# Patient Record
Sex: Female | Born: 1960 | Race: White | Hispanic: No | Smoking: Never smoker
Health system: Southern US, Community
[De-identification: ages and names within clinical notes are randomized; demographics above are authoritative.]

## PROBLEM LIST (undated history)

## (undated) DIAGNOSIS — Z973 Presence of spectacles and contact lenses: Secondary | ICD-10-CM

## (undated) DIAGNOSIS — I10 Essential (primary) hypertension: Secondary | ICD-10-CM

## (undated) DIAGNOSIS — C801 Malignant (primary) neoplasm, unspecified: Secondary | ICD-10-CM

## (undated) DIAGNOSIS — M79669 Pain in unspecified lower leg: Secondary | ICD-10-CM

## (undated) HISTORY — PX: WISDOM TOOTH EXTRACTION: SHX21

## (undated) HISTORY — DX: Pain in unspecified lower leg: M79.669

## (undated) HISTORY — PX: COLONOSCOPY: SHX174

## (undated) HISTORY — PX: FINGER SURGERY: SHX640

## (undated) HISTORY — DX: Malignant (primary) neoplasm, unspecified: C80.1

---

## 1999-12-31 ENCOUNTER — Other Ambulatory Visit: Admission: RE | Admit: 1999-12-31 | Discharge: 1999-12-31 | Payer: Self-pay | Admitting: Gynecology

## 2000-07-23 ENCOUNTER — Other Ambulatory Visit: Admission: RE | Admit: 2000-07-23 | Discharge: 2000-07-23 | Payer: Self-pay | Admitting: Gynecology

## 2000-09-22 ENCOUNTER — Other Ambulatory Visit: Admission: RE | Admit: 2000-09-22 | Discharge: 2000-09-22 | Payer: Self-pay | Admitting: Gynecology

## 2001-03-16 ENCOUNTER — Other Ambulatory Visit: Admission: RE | Admit: 2001-03-16 | Discharge: 2001-03-16 | Payer: Self-pay | Admitting: Gynecology

## 2001-09-30 ENCOUNTER — Other Ambulatory Visit: Admission: RE | Admit: 2001-09-30 | Discharge: 2001-09-30 | Payer: Self-pay | Admitting: Gynecology

## 2002-11-09 ENCOUNTER — Other Ambulatory Visit: Admission: RE | Admit: 2002-11-09 | Discharge: 2002-11-09 | Payer: Self-pay | Admitting: Gynecology

## 2002-12-08 ENCOUNTER — Encounter: Admission: RE | Admit: 2002-12-08 | Discharge: 2002-12-08 | Payer: Self-pay | Admitting: Gynecology

## 2002-12-08 ENCOUNTER — Encounter: Payer: Self-pay | Admitting: Gynecology

## 2003-12-11 ENCOUNTER — Encounter: Admission: RE | Admit: 2003-12-11 | Discharge: 2003-12-11 | Payer: Self-pay | Admitting: Gynecology

## 2003-12-11 ENCOUNTER — Other Ambulatory Visit: Admission: RE | Admit: 2003-12-11 | Discharge: 2003-12-11 | Payer: Self-pay | Admitting: Gynecology

## 2004-12-12 ENCOUNTER — Other Ambulatory Visit: Admission: RE | Admit: 2004-12-12 | Discharge: 2004-12-12 | Payer: Self-pay | Admitting: Gynecology

## 2004-12-17 ENCOUNTER — Encounter: Admission: RE | Admit: 2004-12-17 | Discharge: 2004-12-17 | Payer: Self-pay | Admitting: Gynecology

## 2005-02-06 ENCOUNTER — Ambulatory Visit: Payer: Self-pay | Admitting: Hematology

## 2005-07-16 ENCOUNTER — Ambulatory Visit: Payer: Self-pay | Admitting: Family Medicine

## 2005-09-26 ENCOUNTER — Ambulatory Visit: Payer: Self-pay | Admitting: Family Medicine

## 2006-01-12 ENCOUNTER — Other Ambulatory Visit: Admission: RE | Admit: 2006-01-12 | Discharge: 2006-01-12 | Payer: Self-pay | Admitting: Gynecology

## 2006-01-12 ENCOUNTER — Encounter: Admission: RE | Admit: 2006-01-12 | Discharge: 2006-01-12 | Payer: Self-pay | Admitting: Gynecology

## 2007-01-19 ENCOUNTER — Other Ambulatory Visit: Admission: RE | Admit: 2007-01-19 | Discharge: 2007-01-19 | Payer: Self-pay | Admitting: Gynecology

## 2007-01-19 ENCOUNTER — Encounter: Admission: RE | Admit: 2007-01-19 | Discharge: 2007-01-19 | Payer: Self-pay | Admitting: Gynecology

## 2012-12-12 DIAGNOSIS — I10 Essential (primary) hypertension: Secondary | ICD-10-CM | POA: Insufficient documentation

## 2013-09-02 ENCOUNTER — Other Ambulatory Visit: Payer: Self-pay | Admitting: Orthopedic Surgery

## 2013-09-28 ENCOUNTER — Encounter (HOSPITAL_BASED_OUTPATIENT_CLINIC_OR_DEPARTMENT_OTHER): Payer: Self-pay | Admitting: *Deleted

## 2013-09-28 NOTE — Progress Notes (Signed)
Finally got correct number-will come in 630 to get istat and ekg since she does take a htn med-

## 2013-09-29 ENCOUNTER — Encounter (HOSPITAL_BASED_OUTPATIENT_CLINIC_OR_DEPARTMENT_OTHER): Payer: Self-pay | Admitting: *Deleted

## 2013-09-29 ENCOUNTER — Ambulatory Visit (HOSPITAL_BASED_OUTPATIENT_CLINIC_OR_DEPARTMENT_OTHER)
Admission: RE | Admit: 2013-09-29 | Discharge: 2013-09-29 | Disposition: A | Discharge status: Home / Self Care | Payer: BC Managed Care – PPO | Source: Ambulatory Visit | Attending: Orthopedic Surgery | Admitting: Orthopedic Surgery

## 2013-09-29 ENCOUNTER — Encounter (HOSPITAL_BASED_OUTPATIENT_CLINIC_OR_DEPARTMENT_OTHER): Payer: BC Managed Care – PPO | Admitting: Anesthesiology

## 2013-09-29 ENCOUNTER — Ambulatory Visit (HOSPITAL_BASED_OUTPATIENT_CLINIC_OR_DEPARTMENT_OTHER): Payer: BC Managed Care – PPO | Admitting: Anesthesiology

## 2013-09-29 ENCOUNTER — Encounter (HOSPITAL_BASED_OUTPATIENT_CLINIC_OR_DEPARTMENT_OTHER)
Admission: RE | Disposition: A | Discharge status: Home / Self Care | Payer: Self-pay | Source: Ambulatory Visit | Attending: Orthopedic Surgery

## 2013-09-29 DIAGNOSIS — B079 Viral wart, unspecified: Secondary | ICD-10-CM | POA: Insufficient documentation

## 2013-09-29 DIAGNOSIS — M19049 Primary osteoarthritis, unspecified hand: Secondary | ICD-10-CM | POA: Insufficient documentation

## 2013-09-29 DIAGNOSIS — M6740 Ganglion, unspecified site: Secondary | ICD-10-CM | POA: Insufficient documentation

## 2013-09-29 HISTORY — DX: Presence of spectacles and contact lenses: Z97.3

## 2013-09-29 HISTORY — DX: Essential (primary) hypertension: I10

## 2013-09-29 HISTORY — PX: EXCISION METACARPAL MASS: SHX6372

## 2013-09-29 LAB — POCT I-STAT, CHEM 8
HCT: 40 % (ref 36.0–46.0)
Hemoglobin: 13.6 g/dL (ref 12.0–15.0)
Sodium: 140 mEq/L (ref 135–145)
TCO2: 22 mmol/L (ref 0–100)

## 2013-09-29 SURGERY — EXCISION METACARPAL MASS
Anesthesia: General | Site: Hand | Laterality: Left | Wound class: Clean

## 2013-09-29 MED ORDER — FENTANYL CITRATE 0.05 MG/ML IJ SOLN
25.0000 ug | INTRAMUSCULAR | Status: DC | PRN
  Administered 2013-09-29 (×2): 25 ug via INTRAVENOUS

## 2013-09-29 MED ORDER — DEXAMETHASONE SODIUM PHOSPHATE 4 MG/ML IJ SOLN
INTRAMUSCULAR | Status: DC | PRN
  Administered 2013-09-29: 10 mg via INTRAVENOUS

## 2013-09-29 MED ORDER — CHLORHEXIDINE GLUCONATE 4 % EX LIQD: 60.0000 mL | Freq: Once | CUTANEOUS | Status: DC

## 2013-09-29 MED ORDER — BUPIVACAINE HCL (PF) 0.25 % IJ SOLN
INTRAMUSCULAR | Status: DC | PRN
  Administered 2013-09-29: 9 mL

## 2013-09-29 MED ORDER — MIDAZOLAM HCL 2 MG/2ML IJ SOLN: 1.0000 mg | INTRAMUSCULAR | Status: DC | PRN

## 2013-09-29 MED ORDER — FENTANYL CITRATE 0.05 MG/ML IJ SOLN
INTRAMUSCULAR | Status: AC
  Filled 2013-09-29: qty 4

## 2013-09-29 MED ORDER — MIDAZOLAM HCL 2 MG/2ML IJ SOLN
INTRAMUSCULAR | Status: AC
  Filled 2013-09-29: qty 2

## 2013-09-29 MED ORDER — LIDOCAINE HCL (PF) 1 % IJ SOLN
INTRAMUSCULAR | Status: AC
  Filled 2013-09-29: qty 5

## 2013-09-29 MED ORDER — ONDANSETRON HCL 4 MG/2ML IJ SOLN
INTRAMUSCULAR | Status: DC | PRN
  Administered 2013-09-29: 4 mg via INTRAVENOUS

## 2013-09-29 MED ORDER — CEFAZOLIN SODIUM-DEXTROSE 2-3 GM-% IV SOLR
2.0000 g | INTRAVENOUS | Status: AC
  Administered 2013-09-29: 2 g via INTRAVENOUS

## 2013-09-29 MED ORDER — PROPOFOL 10 MG/ML IV BOLUS
INTRAVENOUS | Status: DC | PRN
  Administered 2013-09-29: 200 mg via INTRAVENOUS

## 2013-09-29 MED ORDER — OXYCODONE HCL 5 MG/5ML PO SOLN: 5.0000 mg | Freq: Once | ORAL | Status: DC | PRN

## 2013-09-29 MED ORDER — LACTATED RINGERS IV SOLN
INTRAVENOUS | Status: DC
  Administered 2013-09-29 (×2): via INTRAVENOUS

## 2013-09-29 MED ORDER — PROMETHAZINE HCL 25 MG/ML IJ SOLN: 6.2500 mg | INTRAMUSCULAR | Status: DC | PRN

## 2013-09-29 MED ORDER — BUPIVACAINE HCL (PF) 0.25 % IJ SOLN
INTRAMUSCULAR | Status: AC
  Filled 2013-09-29: qty 30

## 2013-09-29 MED ORDER — FENTANYL CITRATE 0.05 MG/ML IJ SOLN
INTRAMUSCULAR | Status: DC | PRN
  Administered 2013-09-29: 100 ug via INTRAVENOUS

## 2013-09-29 MED ORDER — OXYCODONE HCL 5 MG PO TABS: 5.0000 mg | ORAL_TABLET | Freq: Once | ORAL | Status: DC | PRN

## 2013-09-29 MED ORDER — LIDOCAINE HCL (CARDIAC) 20 MG/ML IV SOLN
INTRAVENOUS | Status: DC | PRN
  Administered 2013-09-29: 75 mg via INTRAVENOUS

## 2013-09-29 MED ORDER — FENTANYL CITRATE 0.05 MG/ML IJ SOLN: 50.0000 ug | INTRAMUSCULAR | Status: DC | PRN

## 2013-09-29 MED ORDER — FENTANYL CITRATE 0.05 MG/ML IJ SOLN
INTRAMUSCULAR | Status: AC
  Filled 2013-09-29: qty 2

## 2013-09-29 MED ORDER — MIDAZOLAM HCL 5 MG/5ML IJ SOLN
INTRAMUSCULAR | Status: DC | PRN
  Administered 2013-09-29: 2 mg via INTRAVENOUS

## 2013-09-29 MED ORDER — CEFAZOLIN SODIUM-DEXTROSE 2-3 GM-% IV SOLR
INTRAVENOUS | Status: AC
  Filled 2013-09-29: qty 50

## 2013-09-29 MED ORDER — HYDROCODONE-ACETAMINOPHEN 5-325 MG PO TABS: ORAL_TABLET | ORAL | Status: DC

## 2013-09-29 MED ORDER — PROPOFOL 10 MG/ML IV EMUL
INTRAVENOUS | Status: AC
  Filled 2013-09-29: qty 50

## 2013-09-29 SURGICAL SUPPLY — 58 items
APL SKNCLS STERI-STRIP NONHPOA (GAUZE/BANDAGES/DRESSINGS)
BANDAGE COBAN STERILE 2 (GAUZE/BANDAGES/DRESSINGS) IMPLANT
BANDAGE CONFORM 2  STR LF (GAUZE/BANDAGES/DRESSINGS) IMPLANT
BANDAGE ELASTIC 3 VELCRO ST LF (GAUZE/BANDAGES/DRESSINGS) IMPLANT
BANDAGE GAUZE ELAST BULKY 4 IN (GAUZE/BANDAGES/DRESSINGS) IMPLANT
BANDAGE GAUZE STRT 1 STR LF (GAUZE/BANDAGES/DRESSINGS) IMPLANT
BENZOIN TINCTURE PRP APPL 2/3 (GAUZE/BANDAGES/DRESSINGS) IMPLANT
BLADE MINI RND TIP GREEN BEAV (BLADE) IMPLANT
BLADE SURG 15 STRL LF DISP TIS (BLADE) ×2 IMPLANT
BLADE SURG 15 STRL SS (BLADE) ×4
BNDG CMPR 9X4 STRL LF SNTH (GAUZE/BANDAGES/DRESSINGS) ×1
BNDG CMPR MD 5X2 ELC HKLP STRL (GAUZE/BANDAGES/DRESSINGS)
BNDG COHESIVE 1X5 TAN STRL LF (GAUZE/BANDAGES/DRESSINGS) ×1 IMPLANT
BNDG ELASTIC 2 VLCR STRL LF (GAUZE/BANDAGES/DRESSINGS) IMPLANT
BNDG ESMARK 4X9 LF (GAUZE/BANDAGES/DRESSINGS) ×1 IMPLANT
BNDG PLASTER X FAST 3X3 WHT LF (CAST SUPPLIES) IMPLANT
BNDG PLSTR 9X3 FST ST WHT (CAST SUPPLIES)
CHLORAPREP W/TINT 26ML (MISCELLANEOUS) ×2 IMPLANT
CORDS BIPOLAR (ELECTRODE) ×2 IMPLANT
COVER MAYO STAND STRL (DRAPES) ×2 IMPLANT
COVER TABLE BACK 60X90 (DRAPES) ×2 IMPLANT
CUFF TOURNIQUET SINGLE 18IN (TOURNIQUET CUFF) ×2 IMPLANT
DRAPE EXTREMITY T 121X128X90 (DRAPE) ×2 IMPLANT
DRAPE SURG 17X23 STRL (DRAPES) ×2 IMPLANT
GAUZE XEROFORM 1X8 LF (GAUZE/BANDAGES/DRESSINGS) ×2 IMPLANT
GLOVE BIO SURGEON STRL SZ7.5 (GLOVE) ×2 IMPLANT
GLOVE BIOGEL PI IND STRL 7.0 (GLOVE) IMPLANT
GLOVE BIOGEL PI IND STRL 7.5 (GLOVE) IMPLANT
GLOVE BIOGEL PI IND STRL 8 (GLOVE) ×1 IMPLANT
GLOVE BIOGEL PI INDICATOR 7.0 (GLOVE) ×1
GLOVE BIOGEL PI INDICATOR 7.5 (GLOVE) ×1
GLOVE BIOGEL PI INDICATOR 8 (GLOVE) ×1
GLOVE SURG SS PI 7.0 STRL IVOR (GLOVE) ×1 IMPLANT
GOWN BRE IMP PREV XXLGXLNG (GOWN DISPOSABLE) ×2 IMPLANT
GOWN PREVENTION PLUS XLARGE (GOWN DISPOSABLE) ×2 IMPLANT
NDL HYPO 25X1 1.5 SAFETY (NEEDLE) ×1 IMPLANT
NEEDLE HYPO 25X1 1.5 SAFETY (NEEDLE) ×2 IMPLANT
NS IRRIG 1000ML POUR BTL (IV SOLUTION) ×2 IMPLANT
PACK BASIN DAY SURGERY FS (CUSTOM PROCEDURE TRAY) ×2 IMPLANT
PAD CAST 3X4 CTTN HI CHSV (CAST SUPPLIES) IMPLANT
PAD CAST 4YDX4 CTTN HI CHSV (CAST SUPPLIES) IMPLANT
PADDING CAST ABS 4INX4YD NS (CAST SUPPLIES) ×1
PADDING CAST ABS COTTON 4X4 ST (CAST SUPPLIES) ×1 IMPLANT
PADDING CAST COTTON 3X4 STRL (CAST SUPPLIES)
PADDING CAST COTTON 4X4 STRL (CAST SUPPLIES)
SPLINT FNGR PLAIN END 5/8X3.25 (CAST SUPPLIES) IMPLANT
SPLINT PLASTALUME 3 1/4 (CAST SUPPLIES) ×2
SPONGE GAUZE 4X4 12PLY (GAUZE/BANDAGES/DRESSINGS) ×2 IMPLANT
STOCKINETTE 4X48 STRL (DRAPES) ×2 IMPLANT
STRIP CLOSURE SKIN 1/2X4 (GAUZE/BANDAGES/DRESSINGS) IMPLANT
SUT ETHILON 3 0 PS 1 (SUTURE) IMPLANT
SUT ETHILON 4 0 PS 2 18 (SUTURE) ×2 IMPLANT
SUT ETHILON 5 0 P 3 18 (SUTURE) ×1
SUT NYLON ETHILON 5-0 P-3 1X18 (SUTURE) IMPLANT
SYR BULB 3OZ (MISCELLANEOUS) ×2 IMPLANT
SYR CONTROL 10ML LL (SYRINGE) ×2 IMPLANT
TOWEL OR 17X24 6PK STRL BLUE (TOWEL DISPOSABLE) ×3 IMPLANT
UNDERPAD 30X30 INCONTINENT (UNDERPADS AND DIAPERS) ×1 IMPLANT

## 2013-09-29 NOTE — Op Note (Signed)

## 2013-09-29 NOTE — Anesthesia Procedure Notes (Signed)
Procedure Name: LMA Insertion Date/Time: 09/29/2013 8:39 AM Performed by: Zenia Resides D Pre-anesthesia Checklist: Patient identified, Emergency Drugs available, Suction available and Patient being monitored Patient Re-evaluated:Patient Re-evaluated prior to inductionOxygen Delivery Method: Circle System Utilized Preoxygenation: Pre-oxygenation with 100% oxygen Intubation Type: IV induction Ventilation: Mask ventilation without difficulty LMA: LMA inserted LMA Size: 4.0 Number of attempts: 1 Airway Equipment and Method: bite block Placement Confirmation: positive ETCO2 Tube secured with: Tape Dental Injury: Teeth and Oropharynx as per pre-operative assessment

## 2013-09-29 NOTE — Anesthesia Preprocedure Evaluation (Addendum)
Anesthesia Evaluation  Patient identified by MRN, date of birth, ID band Patient awake    Reviewed: Allergy & Precautions, H&P , NPO status   History of Anesthesia Complications Negative for: history of anesthetic complications  Airway Mallampati: II  Neck ROM: Full    Dental   Pulmonary neg pulmonary ROS,  breath sounds clear to auscultation        Cardiovascular hypertension, Rhythm:Regular Rate:Normal     Neuro/Psych    GI/Hepatic   Endo/Other    Renal/GU      Musculoskeletal   Abdominal   Peds  Hematology   Anesthesia Other Findings   Reproductive/Obstetrics                           Anesthesia Physical Anesthesia Plan  ASA: II  Anesthesia Plan: General   Post-op Pain Management:    Induction: Intravenous  Airway Management Planned: LMA  Additional Equipment:   Intra-op Plan:   Post-operative Plan: Extubation in OR  Informed Consent: I have reviewed the patients History and Physical, chart, labs and discussed the procedure including the risks, benefits and alternatives for the proposed anesthesia with the patient or authorized representative who has indicated his/her understanding and acceptance.   Dental advisory given  Plan Discussed with: CRNA and Surgeon  Anesthesia Plan Comments:        Anesthesia Quick Evaluation

## 2013-09-29 NOTE — H&P (Addendum)
Cheryl Willis is an 52 y.o. female.   Chief Complaint: left index finger mass HPI: 52 yo rhd female with 2 months cyst on left index finger.  This is bothersome to her.  Pain when bumped.  No injuries noted.  No drainage.  She wishes to have it removed.  Past Medical History  Diagnosis Date  . Hypertension   . Wears glasses     Past Surgical History  Procedure Laterality Date  . Wisdom tooth extraction    . Colonoscopy      History reviewed. No pertinent family history. Social History:  reports that she has never smoked. She does not have any smokeless tobacco history on file. She reports that she drinks alcohol. She reports that she does not use illicit drugs.  Allergies: No Known Allergies  Medications Prior to Admission  Medication Sig Dispense Refill  . aspirin 81 MG tablet Take 81 mg by mouth daily.      . calcium carbonate (OS-CAL) 600 MG TABS tablet Take 600 mg by mouth 2 (two) times daily with a meal.      . etonogestrel-ethinyl estradiol (NUVARING) 0.12-0.015 MG/24HR vaginal ring Place 1 each vaginally every 28 (twenty-eight) days. Insert vaginally and leave in place for 3 consecutive weeks, then remove for 1 week.      . losartan (COZAAR) 50 MG tablet Take 50 mg by mouth daily.      . Multiple Vitamins-Minerals (MULTIVITAMIN WITH MINERALS) tablet Take 1 tablet by mouth daily.        Results for orders placed during the hospital encounter of 09/29/13 (from the past 48 hour(s))  POCT I-STAT, CHEM 8     Status: Abnormal   Collection Time    09/29/13  7:04 AM      Result Value Range   Sodium 140  135 - 145 mEq/L   Potassium 3.2 (*) 3.5 - 5.1 mEq/L   Chloride 107  96 - 112 mEq/L   BUN 19  6 - 23 mg/dL   Creatinine, Ser 5.62  0.50 - 1.10 mg/dL   Glucose, Bld 130 (*) 70 - 99 mg/dL   Calcium, Ion 8.65  7.84 - 1.23 mmol/L   TCO2 22  0 - 100 mmol/L   Hemoglobin 13.6  12.0 - 15.0 g/dL   HCT 69.6  29.5 - 28.4 %    No results found.   A comprehensive review of  systems was negative except for: Eyes: positive for contacts/glasses  Blood pressure 119/85, pulse 77, temperature 98.4 F (36.9 C), temperature source Oral, resp. rate 16, height 5\' 2"  (1.575 m), weight 141 lb (63.957 kg), last menstrual period 09/27/2013, SpO2 98.00%.  General appearance: alert, cooperative and appears stated age Head: Normocephalic, without obvious abnormality, atraumatic Neck: supple, symmetrical, trachea midline Resp: clear to auscultation bilaterally Cardio: regular rate and rhythm GI: non tender Extremities: intact sensation and capillary refill all digits.  +epl/fpl/io.  left index with mass dorsally proximal to cuticle.  skin surrounding is irritated.  no proximal streaks.  skin over top irregular. Pulses: 2+ and symmetric Skin: Skin color, texture, turgor normal. No rashes or lesions Neurologic: Grossly normal Incision/Wound: none  Assessment/Plan Left index finger mucoid cyst vs enlarging wart.  Non operative and operative treatment options were discussed with the patient and patient wishes to proceed with operative treatment. Risks, benefits, and alternatives of surgery were discussed and the patient agrees with the plan of care.   Annice Jolly R 09/29/2013, 8:24 AM

## 2013-09-29 NOTE — Transfer of Care (Signed)
Immediate Anesthesia Transfer of Care Note  Patient: Cheryl Willis  Procedure(s) Performed: Procedure(s): LEFT INDEX EXCISION MASS AND DEBRIDEMENT DIP JOINT (Left)  Patient Location: PACU  Anesthesia Type:General  Level of Consciousness: awake, alert  and oriented  Airway & Oxygen Therapy: Patient Spontanous Breathing and Patient connected to face mask oxygen  Post-op Assessment: Post -op Vital signs reviewed and stable  Post vital signs: Reviewed and stable  Complications: No apparent anesthesia complications

## 2013-09-29 NOTE — Brief Op Note (Signed)
09/29/2013  9:20 AM  PATIENT:  Cheryl Willis  52 y.o. 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  POST-OPERATIVE DIAGNOSIS:  Left index finger Mucoid Cyst   PROCEDURE:  Procedure(s): LEFT INDEX EXCISION MASS AND DEBRIDEMENT DIP JOINT  SURGEON:  Surgeon(s): Tami Ribas, MD  PHYSICIAN ASSISTANT:   ASSISTANTS: none   ANESTHESIA:   general  EBL:  Total I/O In: 1000 [I.V.:1000] Out: -   DRAINS: none   LOCAL MEDICATIONS USED:  MARCAINE     SPECIMEN:  Source of Specimen:  left index finger mucoid cyst and verruca  DISPOSITION OF SPECIMEN:  PATHOLOGY  COUNTS:  YES  TOURNIQUET:   Total Tourniquet Time Documented: Upper Arm (Left) - 29 minutes Total: Upper Arm (Left) - 29 minutes   DICTATION: .Other Dictation: Dictation Number [id number]  PLAN OF CARE: Discharge to home after PACU

## 2013-09-30 ENCOUNTER — Encounter (HOSPITAL_BASED_OUTPATIENT_CLINIC_OR_DEPARTMENT_OTHER): Payer: Self-pay | Admitting: Orthopedic Surgery

## 2013-09-30 NOTE — Op Note (Signed)
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  POSTOPERATIVE DIAGNOSIS:  Left index finger mucoid cyst, DIP arthrosis, and verruca.  PROCEDURES:  Excision of mass, left index finger; debridement of DIP joint; and excision of verruca.  SURGEON:  Betha Loa, MD  ASSISTANT:  None.  ANESTHESIA:  General.  IV FLUIDS:  Per anesthesia flow sheet.  ESTIMATED BLOOD LOSS:  Minimal.  COMPLICATIONS:  None.  SPECIMENS:  Left index finger mucoid cyst and verruca to pathology.  TOURNIQUET TIME:  29 minutes.  DISPOSITION:  Stable to PACU.  INDICATIONS:  Ms. Cheryl Willis is a 52 year old female who has noted a mass on her left index finger for approximately 2 months.  It is bothersome to her.  She notes pain when she bumps it.  She wished to have it excised.  Risks, benefits, alternatives of the surgery were discussed including risk of blood loss, infection, damage to nerves, vessels, tendons, ligaments, bone; failure of surgery; need for additional surgery, complications with wound healing, continued pain, and recurrence of the mass.  She voiced understanding of these risks and elected to proceed.  OPERATIVE COURSE:  After being identified preoperatively by myself, the patient and I agreed upon procedure and site of procedure.  Surgical site was marked.  Risks, benefits, and alternatives of surgery were reviewed and she wished to proceed.  Surgical consent had been signed. She was given IV Ancef as preoperative antibiotic prophylaxis.  She was transferred to the operating room and placed on the operating room table in supine position, left upper  extremity on arm board.  General anesthesia was induced by Anesthesiology.  Left upper extremity was prepped and draped in normal sterile orthopedic fashion.  Surgical pause was performed between surgeons, anesthesia, operating staff, and all were in agreement as to the patient, procedure, and site of procedure. Tourniquet at the proximal aspect of the extremity was inflated to 250 mmHg after exsanguination of limb with Esmarch bandage.  A hockey stick shaped incision was made at the level of the DIP joint and carried into subcutaneous tissues by spreading technique.  The mass was over the distal phalanx just proximal to the cuticle.  It had a ballotable feel to it.  The skin was also altered and it worked like manner.  There was a small amount of gelatinous fluid underneath the skin.  Some of what appeared to be cyst lining was able to be excised.  The verruca's looking skin was able to be excised and the skin edges reapproximated over the distal phalanx.  Both the cyst and potential verruca were sent to Pathology for examination.  Using fresh tools, the area under the extensor tendon was entered.  The DIP joint was entered and  debrided using the synovectomy rongeurs.  The wounds were all copiously irrigated with sterile saline.  They were then closed with 5-0 nylon in a horizontal mattress fashion.  A digital block was performed with 9 mL of 0.25% plain Marcaine to aid in postoperative analgesia.  The wound was dressed with sterile Xeroform, 4x4s, and wrapped with a Coban dressing lightly.  An AlumaFoam splint was wrapped lightly with Coban as well. The tourniquet was deflated 29 minutes.  Fingertips were pink with brisk capillary refill after deflation of tourniquet.  Operative drapes were broken down.  The patient was awoken from anesthesia safely.  She was transferred back to stretcher and taken to PACU in stable condition.  I will see her back in the office in 1 week for  postoperative followup.  I will give her Norco 5/325, 1-2 p.o. q.6 hours p.r.n. pain, dispensed #30.     Betha Loa, MD     KK/MEDQ  D:  09/29/2013  T:  09/30/2013  Job:  [id number]

## 2013-10-04 NOTE — Anesthesia Postprocedure Evaluation (Signed)
  Anesthesia Post-op Note  Patient: Cheryl Willis  Procedure(s) Performed: Procedure(s): LEFT INDEX EXCISION MASS AND DEBRIDEMENT DIP JOINT (Left)  Patient Location: PACU  Anesthesia Type:General  Level of Consciousness: awake  Airway and Oxygen Therapy: Patient Spontanous Breathing  Post-op Pain: mild  Post-op Assessment: Post-op Vital signs reviewed  Post-op Vital Signs: stable  Complications: No apparent anesthesia complications

## 2013-12-22 ENCOUNTER — Ambulatory Visit (INDEPENDENT_AMBULATORY_CARE_PROVIDER_SITE_OTHER): Payer: BC Managed Care – PPO

## 2013-12-22 VITALS — BP 123/91 | HR 79 | Resp 18

## 2013-12-22 DIAGNOSIS — D485 Neoplasm of uncertain behavior of skin: Secondary | ICD-10-CM

## 2013-12-22 DIAGNOSIS — B351 Tinea unguium: Secondary | ICD-10-CM

## 2013-12-22 MED ORDER — EFINACONAZOLE 10 % EX SOLN: 1.0000 [drp] | Freq: Every day | CUTANEOUS | Status: DC

## 2013-12-22 NOTE — Progress Notes (Signed)
   Subjective:    Patient ID: Cheryl Willis, female    DOB: [birth year], 53 y.o.   MRN: [id number]  HPI my right toenail has been going on for about three years and the left big toenail has been going on for a month and I do keep polis on them and no burning or throbbing and has discolored and the right looks like it is thick    Review of Systems  Constitutional: Negative.   HENT: Negative.   Eyes: Negative.   Respiratory: Negative.   Cardiovascular: Negative.   Gastrointestinal: Negative.   Endocrine: Negative.   Genitourinary: Negative.   Musculoskeletal: Negative.   Skin:       Change in nails  Allergic/Immunologic: Negative.   Neurological: Negative.   Hematological: Negative.   Psychiatric/Behavioral: Negative.        Objective:   Physical Exam Neurovascular status is intact and unremarkable bilateral pedal pulses are palpable epicritic and proprioceptive sensations intact and symmetric. No history of contusion or trauma to the toes or feet are noted both hallux nails show some white longitudinal striations and likely weight superficial onychomycosis affecting her hallux nail plate. However the past month patient developed a hyperpigmented darkened area along the lateral nail plate of the left great toe. Nonpainful or symptomatic no history of trauma is noted. 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 Jimmye Norman in the past. As such cannot rule out a neoplasm of uncertain behavior of the left great toe my recommendations for a biopsy. Do not have biopsied in the office at this time and will make a referral to Dr. Jimmye Norman within the next several days for biopsy of left hallux nail plate and nailbed      Assessment & Plan:  Assessment weight superficial onychomycosis hallux nails bilateral we'll DC nail polish use and initiate topical antifungal therapy prescription for Phineas Semen is called in to pharmacy. Patient  will initiate topical treatment daily for 12 months duration. Patient is also recommended referral to Dr. Jimmye Norman for nail biopsy left hallux nail bed. Followup with the next 3 months for reevaluation and assessment and stressed the importance of the biopsy with Dr. Jimmye Norman within the next week  Harriet Masson DPM

## 2013-12-22 NOTE — Patient Instructions (Signed)
Onychomycosis/Fungal Toenails  WHAT IS IT? An infection that lies within the keratin of your nail plate that is caused by a fungus.  WHY ME? Fungal infections affect all ages, sexes, races, and creeds.  There may be many factors that predispose you to a fungal infection such as age, coexisting medical conditions such as diabetes, or an autoimmune disease; stress, medications, fatigue, genetics, etc.  Bottom line: fungus thrives in a warm, moist environment and your shoes offer such a location.  IS IT CONTAGIOUS? Theoretically, yes.  You do not want to share shoes, nail clippers or files with someone who has fungal toenails.  Walking around barefoot in the same room or sleeping in the same bed is unlikely to transfer the organism.  It is important to realize, however, that fungus can spread easily from one nail to the next on the same foot.  HOW DO WE TREAT THIS?  There are several ways to treat this condition.  Treatment may depend on many factors such as age, medications, pregnancy, liver and kidney conditions, etc.  It is best to ask your doctor which options are available to you.  1. No treatment.   Unlike many other medical concerns, you can live with this condition.  However for many people this can be a painful condition and may lead to ingrown toenails or a bacterial infection.  It is recommended that you keep the nails cut short to help reduce the amount of fungal nail. 2. Topical treatment.  These range from herbal remedies to prescription strength nail lacquers.  About 40-50% effective, topicals require twice daily application for approximately 9 to 12 months or until an entirely new nail has grown out.  The most effective topicals are medical grade medications available through physicians offices. 3. Oral antifungal medications.  With an 80-90% cure rate, the most common oral medication requires 3 to 4 months of therapy and stays in your system for a year as the new nail grows out.  Oral  antifungal medications do require blood work to make sure it is a safe drug for you.  A liver function panel will be performed prior to starting the medication and after the first month of treatment.  It is important to have the blood work performed to avoid any harmful side effects.  In general, this medication safe but blood work is required. 4. Laser Therapy.  This treatment is performed by applying a specialized laser to the affected nail plate.  This therapy is noninvasive, fast, and non-painful.  It is not covered by insurance and is therefore, out of pocket.  The results have been very good with a 80-95% cure rate.  [facility] is the only practice in the area to offer this therapy. 5. Permanent Nail Avulsion.  Removing the entire nail so that a new nail will not grow back.  Apply topical Jublia to affected nails once daily as instructed for 12 months duration.

## 2016-03-05 DIAGNOSIS — D2239 Melanocytic nevi of other parts of face: Secondary | ICD-10-CM | POA: Diagnosis not present

## 2016-03-05 DIAGNOSIS — D485 Neoplasm of uncertain behavior of skin: Secondary | ICD-10-CM | POA: Diagnosis not present

## 2016-03-05 DIAGNOSIS — D225 Melanocytic nevi of trunk: Secondary | ICD-10-CM | POA: Diagnosis not present

## 2016-03-05 DIAGNOSIS — L814 Other melanin hyperpigmentation: Secondary | ICD-10-CM | POA: Diagnosis not present

## 2016-05-23 DIAGNOSIS — I10 Essential (primary) hypertension: Secondary | ICD-10-CM | POA: Diagnosis not present

## 2016-06-24 DIAGNOSIS — D485 Neoplasm of uncertain behavior of skin: Secondary | ICD-10-CM | POA: Diagnosis not present

## 2016-09-11 DIAGNOSIS — Z01419 Encounter for gynecological examination (general) (routine) without abnormal findings: Secondary | ICD-10-CM | POA: Diagnosis not present

## 2016-09-11 DIAGNOSIS — Z1151 Encounter for screening for human papillomavirus (HPV): Secondary | ICD-10-CM | POA: Diagnosis not present

## 2016-09-11 DIAGNOSIS — Z1231 Encounter for screening mammogram for malignant neoplasm of breast: Secondary | ICD-10-CM | POA: Diagnosis not present

## 2016-09-11 DIAGNOSIS — Z1389 Encounter for screening for other disorder: Secondary | ICD-10-CM | POA: Diagnosis not present

## 2016-09-11 DIAGNOSIS — Z130 Encounter for screening for diseases of the blood and blood-forming organs and certain disorders involving the immune mechanism: Secondary | ICD-10-CM | POA: Diagnosis not present

## 2016-09-11 DIAGNOSIS — Z6824 Body mass index (BMI) 24.0-24.9, adult: Secondary | ICD-10-CM | POA: Diagnosis not present

## 2016-09-11 DIAGNOSIS — Z124 Encounter for screening for malignant neoplasm of cervix: Secondary | ICD-10-CM | POA: Diagnosis not present

## 2016-09-18 DIAGNOSIS — Z1389 Encounter for screening for other disorder: Secondary | ICD-10-CM | POA: Diagnosis not present

## 2016-09-18 DIAGNOSIS — M5010 Cervical disc disorder with radiculopathy, unspecified cervical region: Secondary | ICD-10-CM | POA: Diagnosis not present

## 2016-09-18 DIAGNOSIS — Z2821 Immunization not carried out because of patient refusal: Secondary | ICD-10-CM | POA: Diagnosis not present

## 2016-12-03 DIAGNOSIS — M9901 Segmental and somatic dysfunction of cervical region: Secondary | ICD-10-CM | POA: Diagnosis not present

## 2016-12-03 DIAGNOSIS — M9902 Segmental and somatic dysfunction of thoracic region: Secondary | ICD-10-CM | POA: Diagnosis not present

## 2016-12-03 DIAGNOSIS — M542 Cervicalgia: Secondary | ICD-10-CM | POA: Diagnosis not present

## 2016-12-03 DIAGNOSIS — M9903 Segmental and somatic dysfunction of lumbar region: Secondary | ICD-10-CM | POA: Diagnosis not present

## 2016-12-04 DIAGNOSIS — M9902 Segmental and somatic dysfunction of thoracic region: Secondary | ICD-10-CM | POA: Diagnosis not present

## 2016-12-04 DIAGNOSIS — M542 Cervicalgia: Secondary | ICD-10-CM | POA: Diagnosis not present

## 2016-12-04 DIAGNOSIS — M9901 Segmental and somatic dysfunction of cervical region: Secondary | ICD-10-CM | POA: Diagnosis not present

## 2016-12-04 DIAGNOSIS — M9903 Segmental and somatic dysfunction of lumbar region: Secondary | ICD-10-CM | POA: Diagnosis not present

## 2016-12-08 DIAGNOSIS — M9903 Segmental and somatic dysfunction of lumbar region: Secondary | ICD-10-CM | POA: Diagnosis not present

## 2016-12-08 DIAGNOSIS — M542 Cervicalgia: Secondary | ICD-10-CM | POA: Diagnosis not present

## 2016-12-08 DIAGNOSIS — M9902 Segmental and somatic dysfunction of thoracic region: Secondary | ICD-10-CM | POA: Diagnosis not present

## 2016-12-08 DIAGNOSIS — M9901 Segmental and somatic dysfunction of cervical region: Secondary | ICD-10-CM | POA: Diagnosis not present

## 2016-12-11 DIAGNOSIS — M9901 Segmental and somatic dysfunction of cervical region: Secondary | ICD-10-CM | POA: Diagnosis not present

## 2016-12-11 DIAGNOSIS — M9902 Segmental and somatic dysfunction of thoracic region: Secondary | ICD-10-CM | POA: Diagnosis not present

## 2016-12-11 DIAGNOSIS — M9903 Segmental and somatic dysfunction of lumbar region: Secondary | ICD-10-CM | POA: Diagnosis not present

## 2016-12-11 DIAGNOSIS — M542 Cervicalgia: Secondary | ICD-10-CM | POA: Diagnosis not present

## 2016-12-12 DIAGNOSIS — M9902 Segmental and somatic dysfunction of thoracic region: Secondary | ICD-10-CM | POA: Diagnosis not present

## 2016-12-12 DIAGNOSIS — M542 Cervicalgia: Secondary | ICD-10-CM | POA: Diagnosis not present

## 2016-12-12 DIAGNOSIS — M9901 Segmental and somatic dysfunction of cervical region: Secondary | ICD-10-CM | POA: Diagnosis not present

## 2016-12-12 DIAGNOSIS — M9903 Segmental and somatic dysfunction of lumbar region: Secondary | ICD-10-CM | POA: Diagnosis not present

## 2016-12-19 DIAGNOSIS — M9902 Segmental and somatic dysfunction of thoracic region: Secondary | ICD-10-CM | POA: Diagnosis not present

## 2016-12-19 DIAGNOSIS — M542 Cervicalgia: Secondary | ICD-10-CM | POA: Diagnosis not present

## 2016-12-19 DIAGNOSIS — M9901 Segmental and somatic dysfunction of cervical region: Secondary | ICD-10-CM | POA: Diagnosis not present

## 2016-12-19 DIAGNOSIS — M9903 Segmental and somatic dysfunction of lumbar region: Secondary | ICD-10-CM | POA: Diagnosis not present

## 2016-12-22 DIAGNOSIS — M9902 Segmental and somatic dysfunction of thoracic region: Secondary | ICD-10-CM | POA: Diagnosis not present

## 2016-12-22 DIAGNOSIS — M9903 Segmental and somatic dysfunction of lumbar region: Secondary | ICD-10-CM | POA: Diagnosis not present

## 2016-12-22 DIAGNOSIS — M9901 Segmental and somatic dysfunction of cervical region: Secondary | ICD-10-CM | POA: Diagnosis not present

## 2016-12-22 DIAGNOSIS — M542 Cervicalgia: Secondary | ICD-10-CM | POA: Diagnosis not present

## 2016-12-24 DIAGNOSIS — M9901 Segmental and somatic dysfunction of cervical region: Secondary | ICD-10-CM | POA: Diagnosis not present

## 2016-12-24 DIAGNOSIS — M542 Cervicalgia: Secondary | ICD-10-CM | POA: Diagnosis not present

## 2016-12-24 DIAGNOSIS — M9903 Segmental and somatic dysfunction of lumbar region: Secondary | ICD-10-CM | POA: Diagnosis not present

## 2016-12-24 DIAGNOSIS — M9902 Segmental and somatic dysfunction of thoracic region: Secondary | ICD-10-CM | POA: Diagnosis not present

## 2016-12-29 DIAGNOSIS — M9903 Segmental and somatic dysfunction of lumbar region: Secondary | ICD-10-CM | POA: Diagnosis not present

## 2016-12-29 DIAGNOSIS — M9902 Segmental and somatic dysfunction of thoracic region: Secondary | ICD-10-CM | POA: Diagnosis not present

## 2016-12-29 DIAGNOSIS — M542 Cervicalgia: Secondary | ICD-10-CM | POA: Diagnosis not present

## 2016-12-29 DIAGNOSIS — M9901 Segmental and somatic dysfunction of cervical region: Secondary | ICD-10-CM | POA: Diagnosis not present

## 2017-03-22 DIAGNOSIS — H6983 Other specified disorders of Eustachian tube, bilateral: Secondary | ICD-10-CM | POA: Diagnosis not present

## 2017-03-22 DIAGNOSIS — I10 Essential (primary) hypertension: Secondary | ICD-10-CM | POA: Diagnosis not present

## 2017-05-29 DIAGNOSIS — L814 Other melanin hyperpigmentation: Secondary | ICD-10-CM | POA: Diagnosis not present

## 2017-05-29 DIAGNOSIS — Z85820 Personal history of malignant melanoma of skin: Secondary | ICD-10-CM | POA: Diagnosis not present

## 2017-05-29 DIAGNOSIS — D225 Melanocytic nevi of trunk: Secondary | ICD-10-CM | POA: Diagnosis not present

## 2017-05-29 DIAGNOSIS — D2239 Melanocytic nevi of other parts of face: Secondary | ICD-10-CM | POA: Diagnosis not present

## 2017-05-29 DIAGNOSIS — D485 Neoplasm of uncertain behavior of skin: Secondary | ICD-10-CM | POA: Diagnosis not present

## 2017-07-02 DIAGNOSIS — Z6820 Body mass index (BMI) 20.0-20.9, adult: Secondary | ICD-10-CM | POA: Diagnosis not present

## 2017-07-02 DIAGNOSIS — Z79899 Other long term (current) drug therapy: Secondary | ICD-10-CM | POA: Diagnosis not present

## 2017-07-02 DIAGNOSIS — I10 Essential (primary) hypertension: Secondary | ICD-10-CM | POA: Diagnosis not present

## 2017-08-06 DIAGNOSIS — J0100 Acute maxillary sinusitis, unspecified: Secondary | ICD-10-CM | POA: Diagnosis not present

## 2017-08-06 DIAGNOSIS — R0789 Other chest pain: Secondary | ICD-10-CM | POA: Diagnosis not present

## 2017-08-06 DIAGNOSIS — Z6820 Body mass index (BMI) 20.0-20.9, adult: Secondary | ICD-10-CM | POA: Diagnosis not present

## 2017-08-11 ENCOUNTER — Telehealth: Payer: Self-pay

## 2017-08-11 NOTE — Telephone Encounter (Signed)
Attempt to reach patient no asnwer/ no [email or web address]

## 2017-08-19 ENCOUNTER — Ambulatory Visit (INDEPENDENT_AMBULATORY_CARE_PROVIDER_SITE_OTHER): Payer: BLUE CROSS/BLUE SHIELD | Admitting: Cardiology

## 2017-08-19 ENCOUNTER — Encounter: Payer: Self-pay | Admitting: Cardiology

## 2017-08-19 DIAGNOSIS — R079 Chest pain, unspecified: Secondary | ICD-10-CM | POA: Diagnosis not present

## 2017-08-19 HISTORY — DX: Chest pain, unspecified: R07.9

## 2017-08-19 NOTE — Progress Notes (Signed)
Cardiology Office Note:    Date:  08/19/2017   ID:  Cheryl Willis, DOB 1961-06-11, MRN [id number]  PCP:  Myer Peer, MD  Cardiologist:  Jenean Lindau, MD   Referring MD: Angelina Sheriff, MD    ASSESSMENT:    1. Chest pain, unspecified type    PLAN:    In order of problems listed above:  1. Primary prevention stressed to the patient. Importance of compliance with diet and medications stressed and she verbalized understanding. Her blood pressure stable. In view of her symptoms having to exercise stress echo. If this is negative I have asked him to start an exercise program walking 30-45 minutes a day on a regular basis. She will also reviewed a copy of her lipids in the next visit. She will be seen in follow-up appointment in 6 months or earlier if she has any concerns. 2. She'll monitor her blood pressure closely and it has been stable over the past several months.   Medication Adjustments/Labs and Tests Ordered: Current medicines are reviewed at length with the patient today.  Concerns regarding medicines are outlined above.  Orders Placed This Encounter  Procedures  . ECHOCARDIOGRAM STRESS TEST   No orders of the defined types were placed in this encounter.    History of Present Illness:    Cheryl Willis is a 56 y.o. female who is being seen today for the evaluation of chest pain at the request of Angelina Sheriff, MD. Patient is a pleasant 56 year old female.She has past medical history of essential hypertension. She denies any history of diabetes mellitus or dyslipidemia. She mentions to me that her primary care physician recently checked her lipids and found them to be unremarkable. Patient has been noticing substernal chest tightness at times. This is not related to exertion. She leads a sedentary lifestyle however she has been at the beach for vacation recently and walks significantly without any symptoms. At the time of my evaluation she is alert awake  oriented and in no distress  Past Medical History:  Diagnosis Date  . Calf pain   . Cancer ([place])   . Hypertension   . Wears glasses     Past Surgical History:  Procedure Laterality Date  . COLONOSCOPY    . EXCISION METACARPAL MASS Left 09/29/2013   Procedure: LEFT INDEX EXCISION MASS AND DEBRIDEMENT DIP JOINT;  Surgeon: Tennis Must, MD;  Location: [facility];  Service: Orthopedics;  Laterality: Left;  . FINGER SURGERY     cyst removed  . WISDOM TOOTH EXTRACTION      Current Medications: Current Meds  Medication Sig  . aspirin 81 MG tablet Take 81 mg by mouth daily.  . calcium carbonate (OS-CAL) 600 MG TABS tablet Take 600 mg by mouth 2 (two) times daily with a meal.  . losartan-hydrochlorothiazide (HYZAAR) 100-25 MG per tablet Take 1 tablet by mouth daily.   . Multiple Vitamins-Minerals (MULTIVITAMIN WITH MINERALS) tablet Take 1 tablet by mouth daily.     Allergies:   Patient has no known allergies.   Social History   Social History  . Marital status: Married    Spouse name: N/A  . Number of children: N/A  . Years of education: N/A   Social History Main Topics  . Smoking status: Never Smoker  . Smokeless tobacco: Never Used  . Alcohol use Yes     Comment: occ  . Drug use: No  . Sexual activity: Not Asked  Other Topics Concern  . None   Social History Narrative  . None     Family History: The patient's family history includes Heart attack in her mother.  ROS:   Please see the history of present illness.    All other systems reviewed and are negative.  EKGs/Labs/Other Studies Reviewed:    The following studies were reviewed today: I reviewed records from primary care physician office including EKG and discussed this with the patient extensively and answered questions to her satisfaction.   Recent Labs: No results found for requested labs within last 8760 hours.  Recent Lipid Panel No results found for: CHOL, TRIG, HDL, CHOLHDL,  VLDL, LDLCALC, LDLDIRECT  Physical Exam:    VS:  BP 110/68   Pulse 82   Ht 5\' 2"  (1.575 m)   Wt 134 lb 1.9 oz (60.8 kg)   SpO2 98%   BMI 24.53 kg/m     Wt Readings from Last 3 Encounters:  08/19/17 134 lb 1.9 oz (60.8 kg)  09/29/13 141 lb (64 kg)     GEN: Patient is in no acute distress HEENT: Normal NECK: No JVD; No carotid bruits LYMPHATICS: No lymphadenopathy CARDIAC: S1 S2 regular, 2/6 systolic murmur at the apex. RESPIRATORY:  Clear to auscultation without rales, wheezing or rhonchi  ABDOMEN: Soft, non-tender, non-distended MUSCULOSKELETAL:  No edema; No deformity  SKIN: Warm and dry NEUROLOGIC:  Alert and oriented x 3 PSYCHIATRIC:  Normal affect    Signed, Jenean Lindau, MD  08/19/2017 3:40 PM    [facility]

## 2017-08-19 NOTE — Patient Instructions (Addendum)
Medication Instructions:  Your physician recommends that you continue on your current medications as directed. Please refer to the Current Medication list given to you today.  Labwork: None   Testing/Procedures: Your physician has requested that you have a stress echocardiogram. For further information please visit [email or web address]. Please follow instruction sheet as given.   Follow-Up: Your physician wants you to follow-up in: 6 months You will receive a reminder letter in the mail two months in advance. If you don't receive a letter, please call our office to schedule the follow-up appointment.  Any Other Special Instructions Will Be Listed Below (If Applicable).  Please note that any paperwork needing to be filled out by the provider will need to be addressed at the front desk prior to seeing the provider. Please note that any paperwork FMLA, Disability or other documents regarding health condition is subject to a $25.00 charge that must be received prior to completion of paperwork in the form of a money order or check.    If you need a refill on your cardiac medications before your next appointment, please call your pharmacy.

## 2017-09-08 ENCOUNTER — Ambulatory Visit (HOSPITAL_BASED_OUTPATIENT_CLINIC_OR_DEPARTMENT_OTHER): Payer: BLUE CROSS/BLUE SHIELD

## 2017-09-08 DIAGNOSIS — D485 Neoplasm of uncertain behavior of skin: Secondary | ICD-10-CM | POA: Diagnosis not present

## 2017-09-11 ENCOUNTER — Ambulatory Visit (HOSPITAL_BASED_OUTPATIENT_CLINIC_OR_DEPARTMENT_OTHER)
Admission: RE | Admit: 2017-09-11 | Discharge: 2017-09-11 | Disposition: A | Discharge status: Home / Self Care | Payer: BLUE CROSS/BLUE SHIELD | Source: Ambulatory Visit | Attending: Cardiology | Admitting: Cardiology

## 2017-09-11 DIAGNOSIS — R079 Chest pain, unspecified: Secondary | ICD-10-CM | POA: Insufficient documentation

## 2017-09-11 NOTE — Progress Notes (Signed)
  Echocardiogram Echocardiogram Stress Test has been performed.  Cheryl Willis 09/11/2017, 3:46 PM

## 2017-09-15 DIAGNOSIS — Z1231 Encounter for screening mammogram for malignant neoplasm of breast: Secondary | ICD-10-CM | POA: Diagnosis not present

## 2017-09-15 DIAGNOSIS — Z130 Encounter for screening for diseases of the blood and blood-forming organs and certain disorders involving the immune mechanism: Secondary | ICD-10-CM | POA: Diagnosis not present

## 2017-09-15 DIAGNOSIS — Z6824 Body mass index (BMI) 24.0-24.9, adult: Secondary | ICD-10-CM | POA: Diagnosis not present

## 2017-09-15 DIAGNOSIS — Z01419 Encounter for gynecological examination (general) (routine) without abnormal findings: Secondary | ICD-10-CM | POA: Diagnosis not present

## 2017-09-15 DIAGNOSIS — N951 Menopausal and female climacteric states: Secondary | ICD-10-CM | POA: Diagnosis not present

## 2017-09-15 DIAGNOSIS — Z1389 Encounter for screening for other disorder: Secondary | ICD-10-CM | POA: Diagnosis not present

## 2018-01-21 DIAGNOSIS — L650 Telogen effluvium: Secondary | ICD-10-CM | POA: Diagnosis not present

## 2018-01-22 DIAGNOSIS — R531 Weakness: Secondary | ICD-10-CM | POA: Diagnosis not present

## 2018-01-22 DIAGNOSIS — L650 Telogen effluvium: Secondary | ICD-10-CM | POA: Diagnosis not present

## 2018-06-09 DIAGNOSIS — B373 Candidiasis of vulva and vagina: Secondary | ICD-10-CM | POA: Diagnosis not present

## 2018-06-09 DIAGNOSIS — L292 Pruritus vulvae: Secondary | ICD-10-CM | POA: Diagnosis not present

## 2018-06-09 DIAGNOSIS — N760 Acute vaginitis: Secondary | ICD-10-CM | POA: Diagnosis not present

## 2018-06-09 DIAGNOSIS — N898 Other specified noninflammatory disorders of vagina: Secondary | ICD-10-CM | POA: Diagnosis not present

## 2018-06-10 DIAGNOSIS — D485 Neoplasm of uncertain behavior of skin: Secondary | ICD-10-CM | POA: Diagnosis not present

## 2018-06-10 DIAGNOSIS — D2239 Melanocytic nevi of other parts of face: Secondary | ICD-10-CM | POA: Diagnosis not present

## 2018-06-10 DIAGNOSIS — Z85820 Personal history of malignant melanoma of skin: Secondary | ICD-10-CM | POA: Diagnosis not present

## 2018-06-10 DIAGNOSIS — L814 Other melanin hyperpigmentation: Secondary | ICD-10-CM | POA: Diagnosis not present

## 2018-06-10 DIAGNOSIS — D225 Melanocytic nevi of trunk: Secondary | ICD-10-CM | POA: Diagnosis not present

## 2018-07-28 ENCOUNTER — Encounter: Payer: Self-pay | Admitting: Cardiology

## 2018-07-28 ENCOUNTER — Ambulatory Visit: Payer: BLUE CROSS/BLUE SHIELD | Admitting: Cardiology

## 2018-07-28 VITALS — BP 108/62 | HR 71 | Ht 62.0 in | Wt 124.0 lb

## 2018-07-28 DIAGNOSIS — N951 Menopausal and female climacteric states: Secondary | ICD-10-CM | POA: Insufficient documentation

## 2018-07-28 DIAGNOSIS — R319 Hematuria, unspecified: Secondary | ICD-10-CM | POA: Insufficient documentation

## 2018-07-28 DIAGNOSIS — D649 Anemia, unspecified: Secondary | ICD-10-CM | POA: Insufficient documentation

## 2018-07-28 DIAGNOSIS — I10 Essential (primary) hypertension: Secondary | ICD-10-CM | POA: Diagnosis not present

## 2018-07-28 HISTORY — DX: Anemia, unspecified: D64.9

## 2018-07-28 HISTORY — DX: Menopausal and female climacteric states: N95.1

## 2018-07-28 HISTORY — DX: Hematuria, unspecified: R31.9

## 2018-07-28 NOTE — Progress Notes (Signed)
Cardiology Office Note:    Date:  07/28/2018   ID:  Cheryl Willis, DOB 05/11/1961, MRN [id number]  PCP:  Myer Peer, MD  Cardiologist:  Jenean Lindau, MD   Referring MD: Myer Peer, MD    ASSESSMENT:    1. Essential hypertension    PLAN:    In order of problems listed above:  1. Primary prevention stressed with the patient.  Importance of compliance with diet and medication stressed and she vocalized understanding.  Her blood pressure is stable. 2. She will be seen in follow-up on an annual basis or earlier if she has any questions.   Medication Adjustments/Labs and Tests Ordered: Current medicines are reviewed at length with the patient today.  Concerns regarding medicines are outlined above.  No orders of the defined types were placed in this encounter.  No orders of the defined types were placed in this encounter.    No chief complaint on file.    History of Present Illness:    Cheryl Willis is a 57 y.o. female.  The patient denies any problems at this time and takes care of activities of daily living.  No chest pain orthopnea or PND.  Patient is walking regularly without any problems.  She has not had blood work recently but is planning to get annual physical by her primary care physician.  Past Medical History:  Diagnosis Date  . Calf pain   . Cancer ([place])   . Hypertension   . Wears glasses     Past Surgical History:  Procedure Laterality Date  . COLONOSCOPY    . EXCISION METACARPAL MASS Left 09/29/2013   Procedure: LEFT INDEX EXCISION MASS AND DEBRIDEMENT DIP JOINT;  Surgeon: Tennis Must, MD;  Location: [facility];  Service: Orthopedics;  Laterality: Left;  . FINGER SURGERY     cyst removed  . WISDOM TOOTH EXTRACTION      Current Medications: Current Meds  Medication Sig  . aspirin 81 MG tablet Take 81 mg by mouth daily.  . calcium carbonate (OS-CAL) 600 MG TABS tablet Take 600 mg by mouth 2 (two) times daily with a  meal.  . finasteride (PROSCAR) 5 MG tablet Take 1 tablet by mouth daily.  Marland Kitchen losartan-hydrochlorothiazide (HYZAAR) 100-25 MG per tablet Take 1 tablet by mouth daily.   . Multiple Vitamins-Minerals (MULTIVITAMIN WITH MINERALS) tablet Take 1 tablet by mouth daily.     Allergies:   Patient has no known allergies.   Social History   Socioeconomic History  . Marital status: Married    Spouse name: Not on file  . Number of children: Not on file  . Years of education: Not on file  . Highest education level: Not on file  Occupational History  . Not on file  Social Needs  . Financial resource strain: Not on file  . Food insecurity:    Worry: Not on file    Inability: Not on file  . Transportation needs:    Medical: Not on file    Non-medical: Not on file  Tobacco Use  . Smoking status: Never Smoker  . Smokeless tobacco: Never Used  Substance and Sexual Activity  . Alcohol use: Yes    Comment: occ  . Drug use: No  . Sexual activity: Not on file  Lifestyle  . Physical activity:    Days per week: Not on file    Minutes per session: Not on file  . Stress: Not on file  Relationships  . Social connections:    Talks on phone: Not on file    Gets together: Not on file    Attends religious service: Not on file    Active member of club or organization: Not on file    Attends meetings of clubs or organizations: Not on file    Relationship status: Not on file  Other Topics Concern  . Not on file  Social History Narrative  . Not on file     Family History: The patient's family history includes Heart attack in her mother.  ROS:   Please see the history of present illness.    All other systems reviewed and are negative.  EKGs/Labs/Other Studies Reviewed:    The following studies were reviewed today: Results of the stress test discussed.   Recent Labs: No results found for requested labs within last 8760 hours.  Recent Lipid Panel No results found for: CHOL, TRIG, HDL,  CHOLHDL, VLDL, LDLCALC, LDLDIRECT  Physical Exam:    VS:  BP 108/62 (BP Location: Right Arm, Patient Position: Sitting, Cuff Size: Normal)   Pulse 71   Ht 5\' 2"  (1.575 m)   Wt 124 lb (56.2 kg)   SpO2 97%   BMI 22.68 kg/m     Wt Readings from Last 3 Encounters:  07/28/18 124 lb (56.2 kg)  08/19/17 134 lb 1.9 oz (60.8 kg)  09/29/13 141 lb (64 kg)     GEN: Patient is in no acute distress HEENT: Normal NECK: No JVD; No carotid bruits LYMPHATICS: No lymphadenopathy CARDIAC: Hear sounds regular, 2/6 systolic murmur at the apex. RESPIRATORY:  Clear to auscultation without rales, wheezing or rhonchi  ABDOMEN: Soft, non-tender, non-distended MUSCULOSKELETAL:  No edema; No deformity  SKIN: Warm and dry NEUROLOGIC:  Alert and oriented x 3 PSYCHIATRIC:  Normal affect   Signed, Jenean Lindau, MD  07/28/2018 8:40 AM    [facility]

## 2018-07-28 NOTE — Patient Instructions (Signed)
Medication Instructions:  Your physician recommends that you continue on your current medications as directed. Please refer to the Current Medication list given to you today.  Labwork: none  Testing/Procedures: none  Follow-Up: Your physician recommends that you schedule a follow-up appointment in: 1 year  Any Other Special Instructions Will Be Listed Below (If Applicable).     If you need a refill on your cardiac medications before your next appointment, please call your pharmacy.   [place], RN, BSN

## 2018-08-10 DIAGNOSIS — Z681 Body mass index (BMI) 19 or less, adult: Secondary | ICD-10-CM | POA: Diagnosis not present

## 2018-08-10 DIAGNOSIS — Z1331 Encounter for screening for depression: Secondary | ICD-10-CM | POA: Diagnosis not present

## 2018-08-10 DIAGNOSIS — Z13220 Encounter for screening for lipoid disorders: Secondary | ICD-10-CM | POA: Diagnosis not present

## 2018-08-10 DIAGNOSIS — Z1339 Encounter for screening examination for other mental health and behavioral disorders: Secondary | ICD-10-CM | POA: Diagnosis not present

## 2018-08-10 DIAGNOSIS — Z0000 Encounter for general adult medical examination without abnormal findings: Secondary | ICD-10-CM | POA: Diagnosis not present

## 2018-08-16 DIAGNOSIS — D485 Neoplasm of uncertain behavior of skin: Secondary | ICD-10-CM | POA: Diagnosis not present

## 2018-08-16 DIAGNOSIS — B351 Tinea unguium: Secondary | ICD-10-CM | POA: Diagnosis not present

## 2018-10-13 DIAGNOSIS — Z1389 Encounter for screening for other disorder: Secondary | ICD-10-CM | POA: Diagnosis not present

## 2018-10-13 DIAGNOSIS — Z01419 Encounter for gynecological examination (general) (routine) without abnormal findings: Secondary | ICD-10-CM | POA: Diagnosis not present

## 2018-10-13 DIAGNOSIS — Z6823 Body mass index (BMI) 23.0-23.9, adult: Secondary | ICD-10-CM | POA: Diagnosis not present

## 2018-10-13 DIAGNOSIS — Z130 Encounter for screening for diseases of the blood and blood-forming organs and certain disorders involving the immune mechanism: Secondary | ICD-10-CM | POA: Diagnosis not present

## 2018-10-13 DIAGNOSIS — Z1231 Encounter for screening mammogram for malignant neoplasm of breast: Secondary | ICD-10-CM | POA: Diagnosis not present

## 2018-12-30 DIAGNOSIS — L299 Pruritus, unspecified: Secondary | ICD-10-CM | POA: Diagnosis not present

## 2018-12-30 DIAGNOSIS — L650 Telogen effluvium: Secondary | ICD-10-CM | POA: Diagnosis not present

## 2019-02-08 DIAGNOSIS — F4323 Adjustment disorder with mixed anxiety and depressed mood: Secondary | ICD-10-CM | POA: Diagnosis not present

## 2019-06-14 DIAGNOSIS — D1801 Hemangioma of skin and subcutaneous tissue: Secondary | ICD-10-CM | POA: Diagnosis not present

## 2019-06-14 DIAGNOSIS — D485 Neoplasm of uncertain behavior of skin: Secondary | ICD-10-CM | POA: Diagnosis not present

## 2019-06-14 DIAGNOSIS — Z85820 Personal history of malignant melanoma of skin: Secondary | ICD-10-CM | POA: Diagnosis not present

## 2019-06-14 DIAGNOSIS — D2239 Melanocytic nevi of other parts of face: Secondary | ICD-10-CM | POA: Diagnosis not present

## 2019-06-14 DIAGNOSIS — D225 Melanocytic nevi of trunk: Secondary | ICD-10-CM | POA: Diagnosis not present

## 2019-08-17 DIAGNOSIS — Z13220 Encounter for screening for lipoid disorders: Secondary | ICD-10-CM | POA: Diagnosis not present

## 2019-08-17 DIAGNOSIS — Z0000 Encounter for general adult medical examination without abnormal findings: Secondary | ICD-10-CM | POA: Diagnosis not present

## 2019-08-17 DIAGNOSIS — Z681 Body mass index (BMI) 19 or less, adult: Secondary | ICD-10-CM | POA: Diagnosis not present

## 2019-08-17 DIAGNOSIS — R636 Underweight: Secondary | ICD-10-CM | POA: Diagnosis not present

## 2019-08-17 DIAGNOSIS — Z131 Encounter for screening for diabetes mellitus: Secondary | ICD-10-CM | POA: Diagnosis not present

## 2019-08-25 DIAGNOSIS — H00012 Hordeolum externum right lower eyelid: Secondary | ICD-10-CM | POA: Diagnosis not present

## 2019-09-08 ENCOUNTER — Encounter: Payer: Self-pay | Admitting: Cardiology

## 2019-09-08 ENCOUNTER — Ambulatory Visit (INDEPENDENT_AMBULATORY_CARE_PROVIDER_SITE_OTHER): Payer: BC Managed Care – PPO | Admitting: Cardiology

## 2019-09-08 ENCOUNTER — Other Ambulatory Visit: Payer: Self-pay

## 2019-09-08 VITALS — BP 110/72 | HR 73 | Ht 62.0 in | Wt 123.6 lb

## 2019-09-08 DIAGNOSIS — I10 Essential (primary) hypertension: Secondary | ICD-10-CM

## 2019-09-08 NOTE — Addendum Note (Signed)
Addended by: Beckey Rutter on: 09/08/2019 11:18 AM   Modules accepted: Orders

## 2019-09-08 NOTE — Patient Instructions (Signed)
Medication Instructions:  Your physician recommends that you continue on your current medications as directed. Please refer to the Current Medication list given to you today.  If you need a refill on your cardiac medications before your next appointment, please call your pharmacy.   Lab work: NONE If you have labs (blood work) drawn today and your tests are completely normal, you will receive your results only by: Marland Kitchen MyChart Message (if you have MyChart) OR . A paper copy in the mail If you have any lab test that is abnormal or we need to change your treatment, we will call you to review the results.  Testing/Procedures: NONE  Follow-Up: At [facility], you and your health needs are our priority.  As part of our continuing mission to provide you with exceptional heart care, we have created designated Provider Care Teams.  These Care Teams include your primary Cardiologist (physician) and Advanced Practice Providers (APPs -  Physician Assistants and Nurse Practitioners) who all work together to provide you with the care you need, when you need it. You will need a follow up appointment in 12 months.

## 2019-09-08 NOTE — Progress Notes (Signed)
Cardiology Office Note:    Date:  09/08/2019   ID:  Cheryl Willis, DOB 03/23/1961, MRN [id number]  PCP:  Cheryl Peer, MD  Cardiologist:  Jenean Lindau, MD   Referring MD: Cheryl Peer, MD    ASSESSMENT:    1. Essential hypertension    PLAN:    In order of problems listed above:  1. Essential hypertension: Blood pressure stable.  Primary prevention stressed with the patient.  Importance of compliance with diet and medication stressed.  I had her lipids available done recently from the [facility] sheet and it was fine. 2. She will be seen in follow-up appointment in annual basis or earlier if she has any concerns.   Medication Adjustments/Labs and Tests Ordered: Current medicines are reviewed at length with the patient today.  Concerns regarding medicines are outlined above.  No orders of the defined types were placed in this encounter.  No orders of the defined types were placed in this encounter.    Chief Complaint  Patient presents with  . Follow-up     History of Present Illness:    Cheryl Willis is a 58 y.o. female.  Patient has past medical history of essential hypertension.  She denies any problems at this time and takes care of activities of daily living.  No chest pain orthopnea or PND.  At the time of my evaluation, the patient is alert awake oriented and in no distress.  She walks on a regular basis at least half an hour 5 days a week.  Past Medical History:  Diagnosis Date  . Calf pain   . Cancer ([place])   . Hypertension   . Wears glasses     Past Surgical History:  Procedure Laterality Date  . COLONOSCOPY    . EXCISION METACARPAL MASS Left 09/29/2013   Procedure: LEFT INDEX EXCISION MASS AND DEBRIDEMENT DIP JOINT;  Surgeon: Tennis Must, MD;  Location: [facility];  Service: Orthopedics;  Laterality: Left;  . FINGER SURGERY     cyst removed  . WISDOM TOOTH EXTRACTION      Current Medications: Current Meds  Medication Sig  .  aspirin 81 MG tablet Take 81 mg by mouth daily.  . calcium carbonate (OS-CAL) 600 MG TABS tablet Take 600 mg by mouth 2 (two) times daily with a meal.  . finasteride (PROSCAR) 5 MG tablet Take 1 tablet by mouth daily.  . Multiple Vitamins-Minerals (MULTIVITAMIN WITH MINERALS) tablet Take 1 tablet by mouth daily.  . valsartan-hydrochlorothiazide (DIOVAN-HCT) 160-25 MG tablet Take 1 tablet by mouth daily.     Allergies:   Patient has no known allergies.   Social History   Socioeconomic History  . Marital status: Divorced    Spouse name: Not on file  . Number of children: Not on file  . Years of education: Not on file  . Highest education level: Not on file  Occupational History  . Not on file  Social Needs  . Financial resource strain: Not on file  . Food insecurity    Worry: Not on file    Inability: Not on file  . Transportation needs    Medical: Not on file    Non-medical: Not on file  Tobacco Use  . Smoking status: Never Smoker  . Smokeless tobacco: Never Used  Substance and Sexual Activity  . Alcohol use: Yes    Comment: occ  . Drug use: No  . Sexual activity: Not on file  Lifestyle  .  Physical activity    Days per week: Not on file    Minutes per session: Not on file  . Stress: Not on file  Relationships  . Social Herbalist on phone: Not on file    Gets together: Not on file    Attends religious service: Not on file    Active member of club or organization: Not on file    Attends meetings of clubs or organizations: Not on file    Relationship status: Not on file  Other Topics Concern  . Not on file  Social History Narrative  . Not on file     Family History: The patient's family history includes Heart attack in her mother.  ROS:   Please see the history of present illness.    All other systems reviewed and are negative.  EKGs/Labs/Other Studies Reviewed:    The following studies were reviewed today: EKG reveals sinus rhythm and  nonspecific ST-T changes and lipids were reviewed from the TPN sheet.   Recent Labs: No results found for requested labs within last 8760 hours.  Recent Lipid Panel No results found for: CHOL, TRIG, HDL, CHOLHDL, VLDL, LDLCALC, LDLDIRECT  Physical Exam:    VS:  BP 110/72   Pulse 73   Ht 5\' 2"  (1.575 m)   Wt 123 lb 9.6 oz (56.1 kg)   SpO2 98%   BMI 22.61 kg/m     Wt Readings from Last 3 Encounters:  09/08/19 123 lb 9.6 oz (56.1 kg)  07/28/18 124 lb (56.2 kg)  08/19/17 134 lb 1.9 oz (60.8 kg)     GEN: Patient is in no acute distress HEENT: Normal NECK: No JVD; No carotid bruits LYMPHATICS: No lymphadenopathy CARDIAC: Hear sounds regular, 2/6 systolic murmur at the apex. RESPIRATORY:  Clear to auscultation without rales, wheezing or rhonchi  ABDOMEN: Soft, non-tender, non-distended MUSCULOSKELETAL:  No edema; No deformity  SKIN: Warm and dry NEUROLOGIC:  Alert and oriented x 3 PSYCHIATRIC:  Normal affect   Signed, Jenean Lindau, MD  09/08/2019 11:08 AM    [place]

## 2019-12-05 DIAGNOSIS — J069 Acute upper respiratory infection, unspecified: Secondary | ICD-10-CM | POA: Diagnosis not present

## 2019-12-05 DIAGNOSIS — Z20822 Contact with and (suspected) exposure to covid-19: Secondary | ICD-10-CM | POA: Diagnosis not present

## 2019-12-07 DIAGNOSIS — U071 COVID-19: Secondary | ICD-10-CM | POA: Diagnosis not present

## 2019-12-07 DIAGNOSIS — R109 Unspecified abdominal pain: Secondary | ICD-10-CM | POA: Diagnosis not present

## 2019-12-07 DIAGNOSIS — N201 Calculus of ureter: Secondary | ICD-10-CM | POA: Diagnosis not present

## 2019-12-15 DIAGNOSIS — Z20828 Contact with and (suspected) exposure to other viral communicable diseases: Secondary | ICD-10-CM | POA: Diagnosis not present

## 2019-12-19 DIAGNOSIS — N200 Calculus of kidney: Secondary | ICD-10-CM | POA: Diagnosis not present

## 2019-12-19 DIAGNOSIS — R310 Gross hematuria: Secondary | ICD-10-CM | POA: Diagnosis not present

## 2019-12-19 DIAGNOSIS — R1031 Right lower quadrant pain: Secondary | ICD-10-CM | POA: Diagnosis not present

## 2020-01-10 DIAGNOSIS — N200 Calculus of kidney: Secondary | ICD-10-CM | POA: Diagnosis not present

## 2020-01-10 DIAGNOSIS — R310 Gross hematuria: Secondary | ICD-10-CM | POA: Diagnosis not present

## 2020-02-07 ENCOUNTER — Telehealth: Payer: Self-pay | Admitting: Cardiology

## 2020-02-07 DIAGNOSIS — Z6823 Body mass index (BMI) 23.0-23.9, adult: Secondary | ICD-10-CM | POA: Diagnosis not present

## 2020-02-07 DIAGNOSIS — Z1231 Encounter for screening mammogram for malignant neoplasm of breast: Secondary | ICD-10-CM | POA: Diagnosis not present

## 2020-02-07 DIAGNOSIS — Z124 Encounter for screening for malignant neoplasm of cervix: Secondary | ICD-10-CM | POA: Diagnosis not present

## 2020-02-07 DIAGNOSIS — Z780 Asymptomatic menopausal state: Secondary | ICD-10-CM | POA: Diagnosis not present

## 2020-02-07 DIAGNOSIS — Z01419 Encounter for gynecological examination (general) (routine) without abnormal findings: Secondary | ICD-10-CM | POA: Diagnosis not present

## 2020-02-07 DIAGNOSIS — Z130 Encounter for screening for diseases of the blood and blood-forming organs and certain disorders involving the immune mechanism: Secondary | ICD-10-CM | POA: Diagnosis not present

## 2020-02-07 DIAGNOSIS — Z1151 Encounter for screening for human papillomavirus (HPV): Secondary | ICD-10-CM | POA: Diagnosis not present

## 2020-02-07 NOTE — Telephone Encounter (Signed)
Cheryl Willis is calling stating she has been experiencing numbness in her left arm that causes her fingers on that hand to turn ice cold. She states this has been going on for the past 2 months. It occurs every week or so and last for about 20 minutes, with no other symptoms present. Cheryl Willis would like to know if this is something she should come in and be seen for. Please advise.

## 2020-02-07 NOTE — Telephone Encounter (Signed)
lpmtcb 3/9

## 2020-02-07 NOTE — Telephone Encounter (Signed)
I will be glad to see her if she wants me to evaluate her for this.

## 2020-02-07 NOTE — Telephone Encounter (Signed)
Pt called back and states that these symptoms have been intermittent for months. Pt states that she had been advised to see her primary care.

## 2020-02-07 NOTE — Telephone Encounter (Signed)
I spoke to the patient who has been experiencing left arm numbness and coldness in her hand.  She denies any other symptoms of CP, SOB or swelling.  Her VS are stable HR in the 70s.   I told her to reach out to her PCP, which is now Dr Venetia Maxon @ [facility].  She verbalized understanding to do that and will call back if he gives other advisement.

## 2020-02-14 ENCOUNTER — Other Ambulatory Visit: Payer: Self-pay | Admitting: Gynecology

## 2020-02-14 DIAGNOSIS — N6489 Other specified disorders of breast: Secondary | ICD-10-CM

## 2020-03-01 ENCOUNTER — Other Ambulatory Visit: Payer: Self-pay

## 2020-03-01 ENCOUNTER — Ambulatory Visit
Admission: RE | Admit: 2020-03-01 | Discharge: 2020-03-01 | Disposition: A | Discharge status: Home / Self Care | Payer: BC Managed Care – PPO | Source: Ambulatory Visit | Attending: Gynecology | Admitting: Gynecology

## 2020-03-01 ENCOUNTER — Ambulatory Visit: Payer: BC Managed Care – PPO

## 2020-03-01 DIAGNOSIS — N6489 Other specified disorders of breast: Secondary | ICD-10-CM

## 2020-03-01 DIAGNOSIS — R922 Inconclusive mammogram: Secondary | ICD-10-CM | POA: Diagnosis not present

## 2020-03-29 DIAGNOSIS — M7551 Bursitis of right shoulder: Secondary | ICD-10-CM | POA: Diagnosis not present

## 2020-03-29 DIAGNOSIS — Z681 Body mass index (BMI) 19 or less, adult: Secondary | ICD-10-CM | POA: Diagnosis not present

## 2020-03-29 DIAGNOSIS — M12811 Other specific arthropathies, not elsewhere classified, right shoulder: Secondary | ICD-10-CM | POA: Diagnosis not present

## 2020-04-16 DIAGNOSIS — M7501 Adhesive capsulitis of right shoulder: Secondary | ICD-10-CM | POA: Diagnosis not present

## 2020-05-02 DIAGNOSIS — M6281 Muscle weakness (generalized): Secondary | ICD-10-CM | POA: Diagnosis not present

## 2020-05-02 DIAGNOSIS — M25511 Pain in right shoulder: Secondary | ICD-10-CM | POA: Diagnosis not present

## 2020-05-02 DIAGNOSIS — M25611 Stiffness of right shoulder, not elsewhere classified: Secondary | ICD-10-CM | POA: Diagnosis not present

## 2020-05-04 DIAGNOSIS — M25611 Stiffness of right shoulder, not elsewhere classified: Secondary | ICD-10-CM | POA: Diagnosis not present

## 2020-05-04 DIAGNOSIS — M25511 Pain in right shoulder: Secondary | ICD-10-CM | POA: Diagnosis not present

## 2020-05-04 DIAGNOSIS — M6281 Muscle weakness (generalized): Secondary | ICD-10-CM | POA: Diagnosis not present

## 2020-05-07 DIAGNOSIS — M25611 Stiffness of right shoulder, not elsewhere classified: Secondary | ICD-10-CM | POA: Diagnosis not present

## 2020-05-07 DIAGNOSIS — M6281 Muscle weakness (generalized): Secondary | ICD-10-CM | POA: Diagnosis not present

## 2020-05-07 DIAGNOSIS — M25511 Pain in right shoulder: Secondary | ICD-10-CM | POA: Diagnosis not present

## 2020-05-09 DIAGNOSIS — M25611 Stiffness of right shoulder, not elsewhere classified: Secondary | ICD-10-CM | POA: Diagnosis not present

## 2020-05-09 DIAGNOSIS — M6281 Muscle weakness (generalized): Secondary | ICD-10-CM | POA: Diagnosis not present

## 2020-05-09 DIAGNOSIS — M25511 Pain in right shoulder: Secondary | ICD-10-CM | POA: Diagnosis not present

## 2020-05-22 DIAGNOSIS — M25511 Pain in right shoulder: Secondary | ICD-10-CM | POA: Diagnosis not present

## 2020-05-22 DIAGNOSIS — M6281 Muscle weakness (generalized): Secondary | ICD-10-CM | POA: Diagnosis not present

## 2020-05-22 DIAGNOSIS — M25611 Stiffness of right shoulder, not elsewhere classified: Secondary | ICD-10-CM | POA: Diagnosis not present

## 2020-05-24 DIAGNOSIS — M6281 Muscle weakness (generalized): Secondary | ICD-10-CM | POA: Diagnosis not present

## 2020-05-24 DIAGNOSIS — M25611 Stiffness of right shoulder, not elsewhere classified: Secondary | ICD-10-CM | POA: Diagnosis not present

## 2020-05-24 DIAGNOSIS — M25511 Pain in right shoulder: Secondary | ICD-10-CM | POA: Diagnosis not present

## 2020-05-29 DIAGNOSIS — M6281 Muscle weakness (generalized): Secondary | ICD-10-CM | POA: Diagnosis not present

## 2020-05-29 DIAGNOSIS — M25611 Stiffness of right shoulder, not elsewhere classified: Secondary | ICD-10-CM | POA: Diagnosis not present

## 2020-05-29 DIAGNOSIS — M25511 Pain in right shoulder: Secondary | ICD-10-CM | POA: Diagnosis not present

## 2020-06-14 DIAGNOSIS — D225 Melanocytic nevi of trunk: Secondary | ICD-10-CM | POA: Diagnosis not present

## 2020-06-14 DIAGNOSIS — D485 Neoplasm of uncertain behavior of skin: Secondary | ICD-10-CM | POA: Diagnosis not present

## 2020-06-14 DIAGNOSIS — L650 Telogen effluvium: Secondary | ICD-10-CM | POA: Diagnosis not present

## 2020-06-14 DIAGNOSIS — D2239 Melanocytic nevi of other parts of face: Secondary | ICD-10-CM | POA: Diagnosis not present

## 2020-06-14 DIAGNOSIS — Z85820 Personal history of malignant melanoma of skin: Secondary | ICD-10-CM | POA: Diagnosis not present

## 2020-07-17 IMAGING — MG MM DIGITAL DIAGNOSTIC UNILAT*R* W/ TOMO W/ CAD
4 series · 4 of 12 positions shown · non-contrast
Comparison: Previous exam(s).

CLINICAL DATA: Possible asymmetry in the 6 o'clock position of the
right breast on a recent screening mammogram.

EXAM:
DIGITAL DIAGNOSTIC UNILATERAL RIGHT MAMMOGRAM WITH CAD AND TOMO

[R MLO synth-2D]
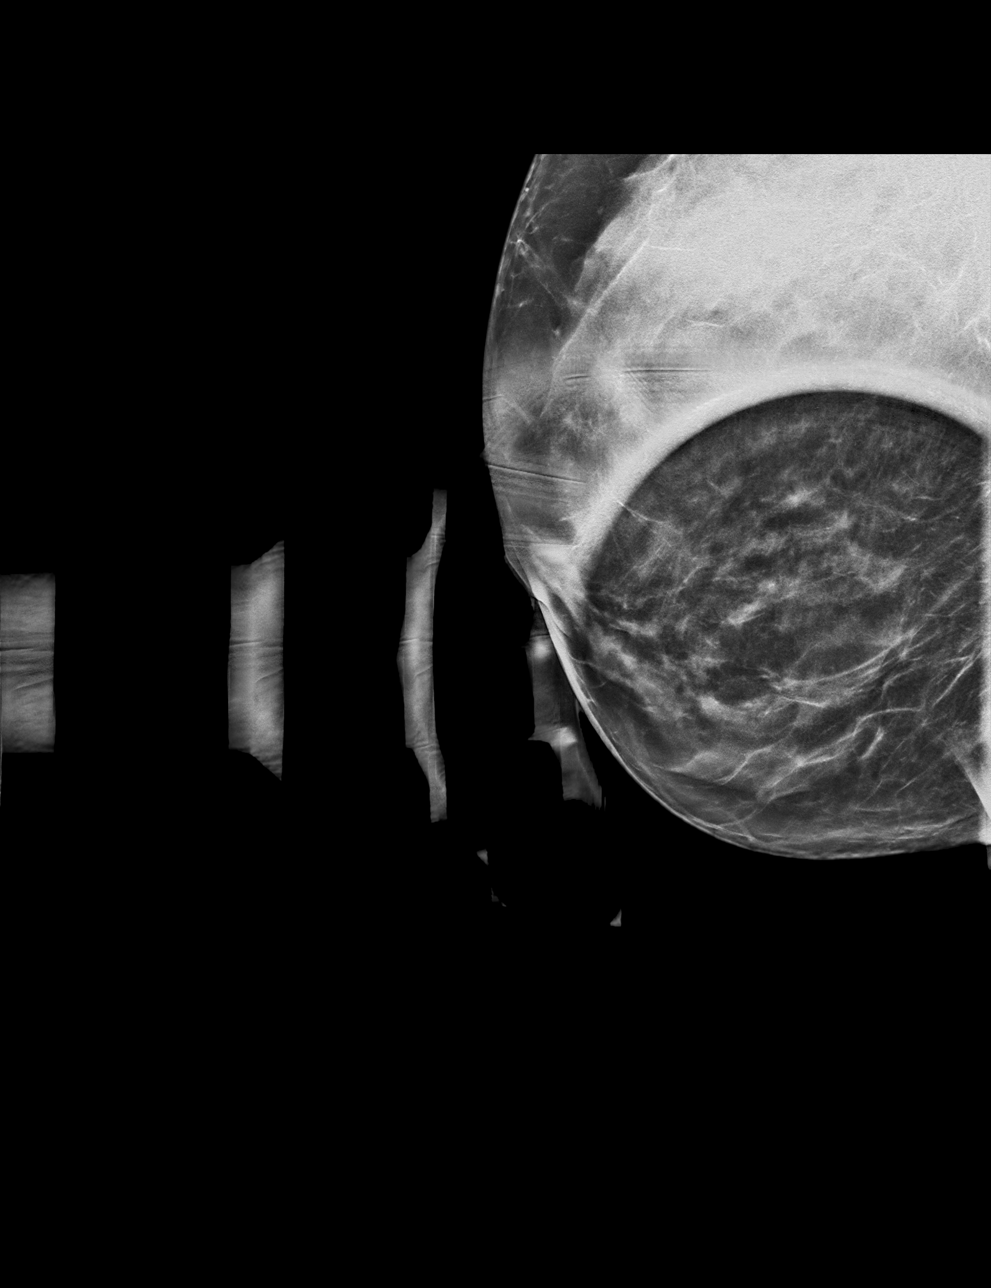

[R CC synth-2D]
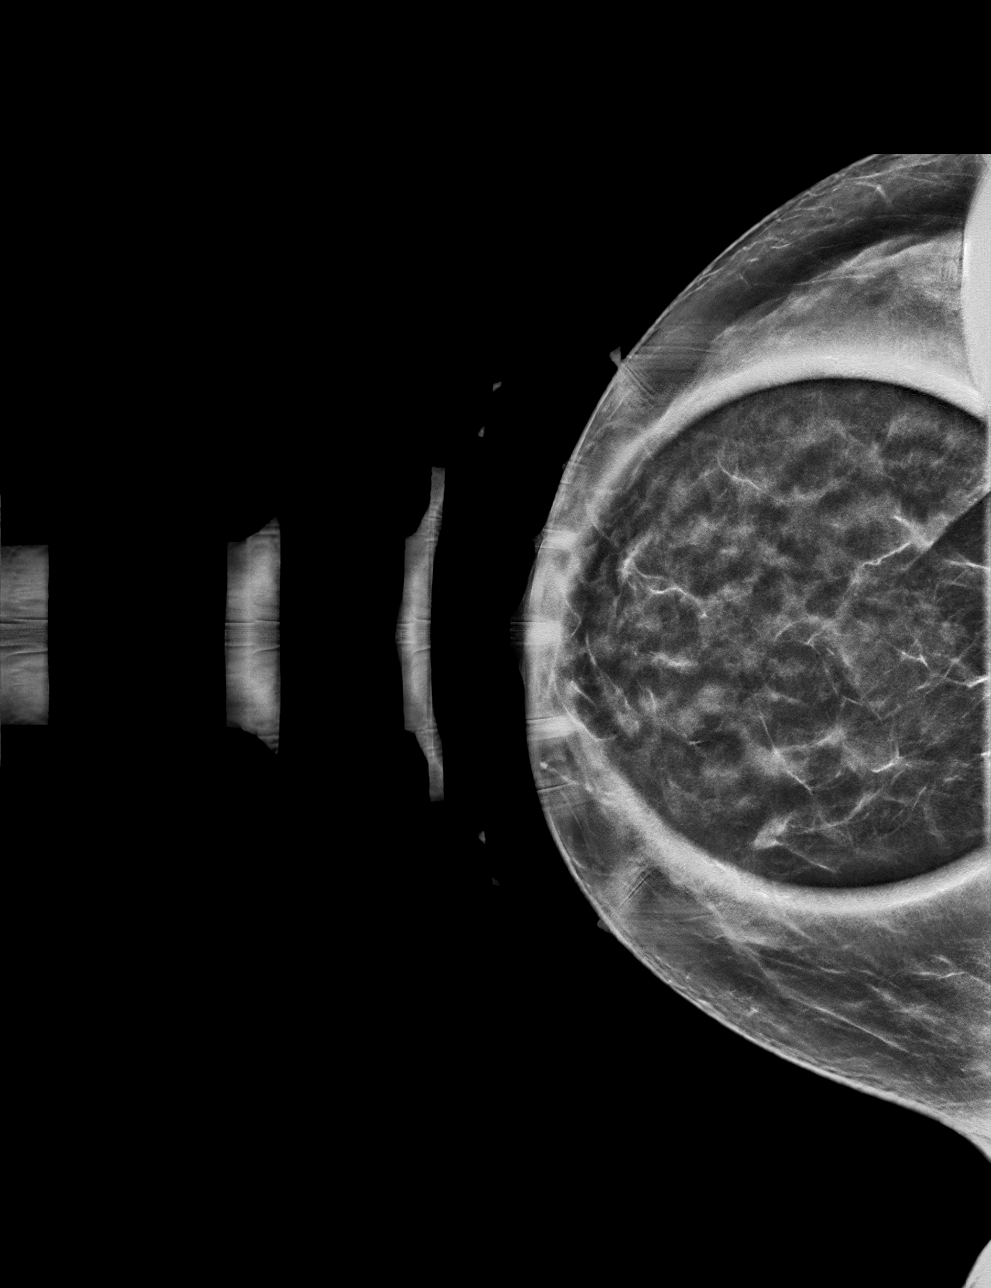

[R MLO tomo · tomo slice 31/60.0]
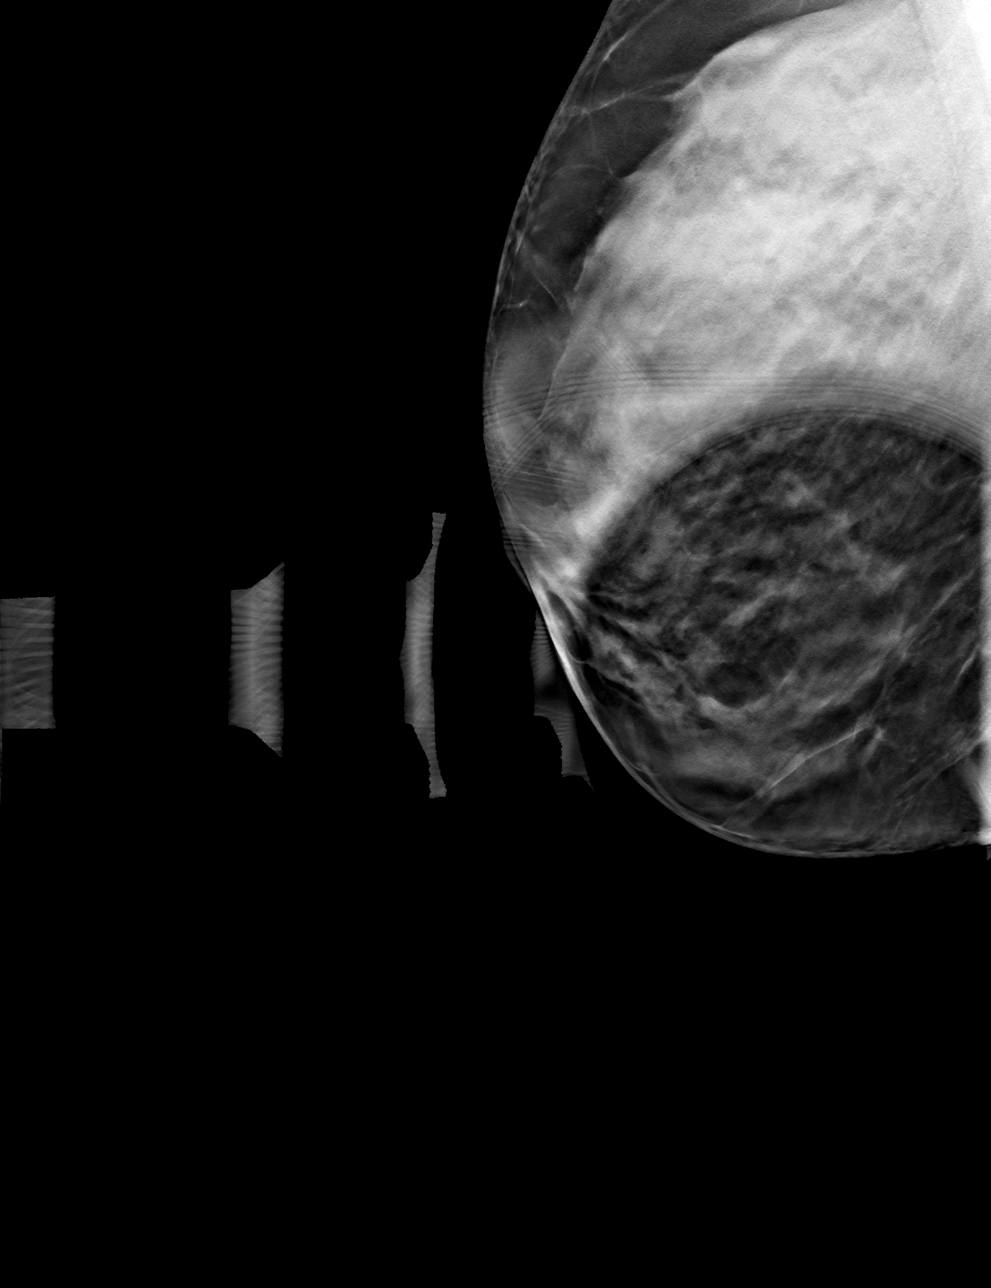

[R CC tomo · tomo slice 33/64.0]
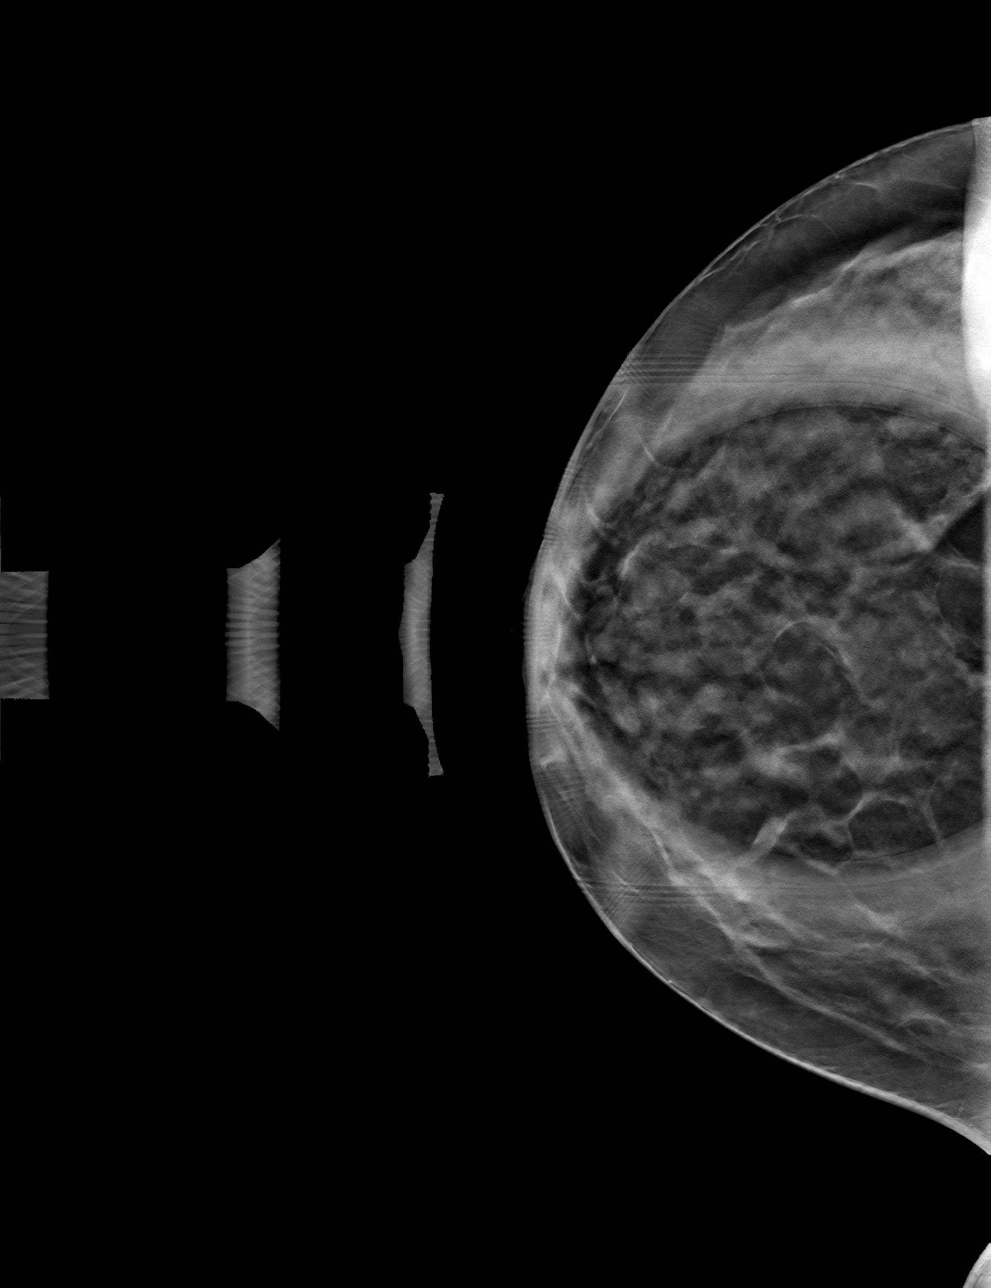

[4 of 12 positions shown; findings below may reference images not displayed]

ACR Breast Density Category c: The breast tissue is heterogeneously
dense, which may obscure small masses.
FINDINGS: 3D tomographic and 2D generated spot compression views of the right
breast demonstrate normal appearing fibroglandular tissue at the
location of recently suspected asymmetry, unchanged compared
previous examinations.

Mammographic images were processed with CAD.
IMPRESSION: No evidence of malignancy. The recently suspected right breast
asymmetry was close apposition of normal breast tissue.

RECOMMENDATION:
Bilateral screening mammogram in 1 year.

I have discussed the findings and recommendations with the patient.
If applicable, a reminder letter will be sent to the patient
regarding the next appointment.

BI-RADS CATEGORY  1: Negative.

## 2020-08-23 DIAGNOSIS — Z681 Body mass index (BMI) 19 or less, adult: Secondary | ICD-10-CM | POA: Diagnosis not present

## 2020-08-23 DIAGNOSIS — Z1331 Encounter for screening for depression: Secondary | ICD-10-CM | POA: Diagnosis not present

## 2020-08-23 DIAGNOSIS — Z13220 Encounter for screening for lipoid disorders: Secondary | ICD-10-CM | POA: Diagnosis not present

## 2020-08-23 DIAGNOSIS — Z131 Encounter for screening for diabetes mellitus: Secondary | ICD-10-CM | POA: Diagnosis not present

## 2020-08-23 DIAGNOSIS — Z0000 Encounter for general adult medical examination without abnormal findings: Secondary | ICD-10-CM | POA: Diagnosis not present

## 2020-09-06 DIAGNOSIS — C801 Malignant (primary) neoplasm, unspecified: Secondary | ICD-10-CM | POA: Insufficient documentation

## 2020-09-06 DIAGNOSIS — Z973 Presence of spectacles and contact lenses: Secondary | ICD-10-CM | POA: Insufficient documentation

## 2020-09-06 DIAGNOSIS — M79669 Pain in unspecified lower leg: Secondary | ICD-10-CM | POA: Insufficient documentation

## 2020-09-07 ENCOUNTER — Other Ambulatory Visit: Payer: Self-pay | Admitting: Cardiology

## 2020-09-07 ENCOUNTER — Ambulatory Visit: Payer: BC Managed Care – PPO | Admitting: Cardiology

## 2020-09-07 ENCOUNTER — Encounter: Payer: Self-pay | Admitting: Cardiology

## 2020-09-07 ENCOUNTER — Other Ambulatory Visit: Payer: Self-pay

## 2020-09-07 ENCOUNTER — Ambulatory Visit (INDEPENDENT_AMBULATORY_CARE_PROVIDER_SITE_OTHER): Payer: BC Managed Care – PPO | Admitting: Cardiology

## 2020-09-07 VITALS — BP 100/78 | HR 76 | Ht 62.0 in | Wt 128.4 lb

## 2020-09-07 DIAGNOSIS — R0789 Other chest pain: Secondary | ICD-10-CM

## 2020-09-07 DIAGNOSIS — E782 Mixed hyperlipidemia: Secondary | ICD-10-CM

## 2020-09-07 DIAGNOSIS — I10 Essential (primary) hypertension: Secondary | ICD-10-CM

## 2020-09-07 HISTORY — DX: Other chest pain: R07.89

## 2020-09-07 HISTORY — DX: Mixed hyperlipidemia: E78.2

## 2020-09-07 MED ORDER — NITROGLYCERIN 0.4 MG SL SUBL: 0.4000 mg | SUBLINGUAL_TABLET | SUBLINGUAL | 3 refills | Status: DC | PRN

## 2020-09-07 MED ORDER — ASPIRIN EC 81 MG PO TBEC: 81.0000 mg | DELAYED_RELEASE_TABLET | Freq: Every day | ORAL | 3 refills | Status: DC

## 2020-09-07 NOTE — Progress Notes (Signed)
Cardiology Office Note:    Date:  09/07/2020   ID:  Cheryl Willis, DOB 07-09-1961, MRN [id number]  PCP:  Street, Cheryl [place], MD  Cardiologist:  Cheryl Lindau, MD   Referring MD: Cheryl Peer, MD    ASSESSMENT:    1. Primary hypertension   2. Chest discomfort   3. Mixed dyslipidemia    PLAN:    In order of problems listed above:  1. Chest discomfort: Patient symptoms are concerning.  However not typical for angina.  Therefore I will do a Lexiscan sestamibi.  In the interim I have asked to be used nitroglycerin on a as needed basis.  Sublingual nitroglycerin prescription was sent, its protocol and 911 protocol explained and the patient vocalized understanding questions were answered to the patient's satisfaction.  She will also take aspirin 81 mg coated on a daily basis till we get the results of these tests. 2. Essential hypertension: Blood pressure stable 3. Mixed dyslipidemia: Lipids were reviewed from KPN sheet and diet was emphasized.  She knows to go to the nearest emergency room for any significant symptoms. 4. Patient will be seen in follow-up appointment in 4 weeks or earlier if the patient has any concerns    Medication Adjustments/Labs and Tests Ordered: Current medicines are reviewed at length with the patient today.  Concerns regarding medicines are outlined above.  No orders of the defined types were placed in this encounter.  No orders of the defined types were placed in this encounter.    No chief complaint on file.    History of Present Illness:    Cheryl Willis is a 59 y.o. female.  Patient has past medical history of essential hypertension and mild dyslipidemia..  Patient mentions to me that she went through a divorce last year.  She mentions to me that she has chest tightness like "elephant sitting on her chest".  The only issue is that this happens all the time virtually.  She also walks 2-3 times a week and walking does not make it any  worse.  No orthopnea or PND at other times.  At the time of my evaluation, the patient is alert awake oriented and in no distress.  No radiation of the symptoms to the neck or to the arm.  This is been happening over the past several months.  Past Medical History:  Diagnosis Date  . Anemia 07/28/2018  . Blood in urine 07/28/2018  . Calf pain   . Cancer ([place])   . Chest pain 08/19/2017  . Hypertension   . Menopausal syndrome 07/28/2018  . Wears glasses     Past Surgical History:  Procedure Laterality Date  . COLONOSCOPY    . EXCISION METACARPAL MASS Left 09/29/2013   Procedure: LEFT INDEX EXCISION MASS AND DEBRIDEMENT DIP JOINT;  Surgeon: Tennis Must, MD;  Location: [facility];  Service: Orthopedics;  Laterality: Left;  . FINGER SURGERY     cyst removed  . WISDOM TOOTH EXTRACTION      Current Medications: Current Meds  Medication Sig  . aspirin 81 MG tablet Take 81 mg by mouth daily.  . calcium carbonate (OS-CAL) 600 MG TABS tablet Take 600 mg by mouth 2 (two) times daily with a meal.  . ibuprofen (ADVIL) 800 MG tablet ibuprofen 800 mg tablet  TAKE 1 TABLET BY MOUTH EVERY 6 HOURS FOR MILD PAIN  . Multiple Vitamins-Minerals (MULTIVITAMIN WITH MINERALS) tablet Take 1 tablet by mouth daily.  . valsartan-hydrochlorothiazide (  DIOVAN-HCT) 160-25 MG tablet Take 1 tablet by mouth daily.     Allergies:   Patient has no known allergies.   Social History   Socioeconomic History  . Marital status: Divorced    Spouse name: Not on file  . Number of children: Not on file  . Years of education: Not on file  . Highest education level: Not on file  Occupational History  . Not on file  Tobacco Use  . Smoking status: Never Smoker  . Smokeless tobacco: Never Used  Vaping Use  . Vaping Use: Never used  Substance and Sexual Activity  . Alcohol use: Yes    Comment: occ  . Drug use: No  . Sexual activity: Not on file  Other Topics Concern  . Not on file  Social History  Narrative  . Not on file   Social Determinants of Health   Financial Resource Strain:   . Difficulty of Paying Living Expenses: Not on file  Food Insecurity:   . Worried About Charity fundraiser in the Last Year: Not on file  . Ran Out of Food in the Last Year: Not on file  Transportation Needs:   . Lack of Transportation (Medical): Not on file  . Lack of Transportation (Non-Medical): Not on file  Physical Activity:   . Days of Exercise per Week: Not on file  . Minutes of Exercise per Session: Not on file  Stress:   . Feeling of Stress : Not on file  Social Connections:   . Frequency of Communication with Friends and Family: Not on file  . Frequency of Social Gatherings with Friends and Family: Not on file  . Attends Religious Services: Not on file  . Active Member of Clubs or Organizations: Not on file  . Attends Archivist Meetings: Not on file  . Marital Status: Not on file     Family History: The patient's family history includes Heart attack in her mother.  ROS:   Please see the history of present illness.    All other systems reviewed and are negative.  EKGs/Labs/Other Studies Reviewed:    The following studies were reviewed today: EKG reveals sinus rhythm and nonspecific ST-T changes.   Recent Labs: No results found for requested labs within last 8760 hours.  Recent Lipid Panel No results found for: CHOL, TRIG, HDL, CHOLHDL, VLDL, LDLCALC, LDLDIRECT  Physical Exam:    VS:  BP 100/78 (BP Location: Left Arm, Patient Position: Sitting, Cuff Size: Normal)   Pulse 76   Ht 5\' 2"  (1.575 m)   Wt 128 lb 6.4 oz (58.2 kg)   SpO2 98%   BMI 23.48 kg/m     Wt Readings from Last 3 Encounters:  09/07/20 128 lb 6.4 oz (58.2 kg)  09/08/19 123 lb 9.6 oz (56.1 kg)  07/28/18 124 lb (56.2 kg)     GEN: Patient is in no acute distress HEENT: Normal NECK: No JVD; No carotid bruits LYMPHATICS: No lymphadenopathy CARDIAC: Hear sounds regular, 2/6 systolic  murmur at the apex. RESPIRATORY:  Clear to auscultation without rales, wheezing or rhonchi  ABDOMEN: Soft, non-tender, non-distended MUSCULOSKELETAL:  No edema; No deformity  SKIN: Warm and dry NEUROLOGIC:  Alert and oriented x 3 PSYCHIATRIC:  Normal affect   Signed, Cheryl Lindau, MD  09/07/2020 8:24 AM    Cheryl Willis

## 2020-09-07 NOTE — Patient Instructions (Signed)
Medication Instructions:  Your physician has recommended you make the following change in your medication:  START: Nitroglycerin 0.4 mg take one tablet by mouth every 5 minutes up to three times as needed for chest pain.  START: Aspirin 81 mg take one tablet by mouth daily. *If you need a refill on your cardiac medications before your next appointment, please call your pharmacy*   Lab Work: .None If you have labs (blood work) drawn today and your tests are completely normal, you will receive your results only by: Marland Kitchen MyChart Message (if you have MyChart) OR . A paper copy in the mail If you have any lab test that is abnormal or we need to change your treatment, we will call you to review the results.   Testing/Procedures:   [facility] [address] [place], [place] [address] Phone:  [phone]    Please arrive 15 minutes prior to your appointment time for registration and insurance purposes.  The test will take approximately 3 to 4 hours to complete; you may bring reading material.  If someone comes with you to your appointment, they will need to remain in the main lobby due to limited space in the testing area. **If you are pregnant or breastfeeding, please notify the nuclear lab prior to your appointment**  How to prepare for your Myocardial Perfusion Test: . Do not eat or drink 3 hours prior to your test, except you may have water. . Do not consume products containing caffeine (regular or decaffeinated) 12 hours prior to your test. (ex: coffee, chocolate, sodas, tea). . Do bring a list of your current medications with you.  If not listed below, you may take your medications as normal. . Do wear comfortable clothes (no dresses or overalls) and walking shoes, tennis shoes preferred (No heels or open toe shoes are allowed). . Do NOT wear cologne, perfume, aftershave, or lotions (deodorant is allowed). . If these instructions are not followed, your test  will have to be rescheduled.  Please report to [address] for your test.  If you have questions or concerns about your appointment, you can call the [facility] at [phone].  If you cannot keep your appointment, please provide 24 hours notification to the Nuclear Lab, to avoid a possible $50 charge to your account.    Follow-Up: At [facility], you and your health needs are our priority.  As part of our continuing mission to provide you with exceptional heart care, we have created designated Provider Care Teams.  These Care Teams include your primary Cardiologist (physician) and Advanced Practice Providers (APPs -  Physician Assistants and Nurse Practitioners) who all work together to provide you with the care you need, when you need it.  We recommend signing up for the patient portal called "MyChart".  Sign up information is provided on this After Visit Summary.  MyChart is used to connect with patients for Virtual Visits (Telemedicine).  Patients are able to view lab/test results, encounter notes, upcoming appointments, etc.  Non-urgent messages can be sent to your provider as well.   To learn more about what you can do with MyChart, go to [email or web address].    Your next appointment:   1 month(s)  The format for your next appointment:   In Person  Provider:   Jyl Heinz, MD   Other Instructions

## 2020-09-12 ENCOUNTER — Telehealth (HOSPITAL_COMMUNITY): Payer: Self-pay | Admitting: *Deleted

## 2020-09-12 NOTE — Telephone Encounter (Signed)
Left message on voicemail per DPR in reference to upcoming appointment scheduled on 09/19/20 at 11:00 with detailed instructions given per Myocardial Perfusion Study Information Sheet for the test. LM to arrive 15 minutes early, and that it is imperative to arrive on time for appointment to keep from having the test rescheduled. If you need to cancel or reschedule your appointment, please call the office within 24 hours of your appointment. Failure to do so may result in a cancellation of your appointment, and a $50 no show fee. Phone number given for call back for any questions.

## 2020-09-17 DIAGNOSIS — E782 Mixed hyperlipidemia: Secondary | ICD-10-CM | POA: Diagnosis not present

## 2020-09-18 DIAGNOSIS — D485 Neoplasm of uncertain behavior of skin: Secondary | ICD-10-CM | POA: Diagnosis not present

## 2020-09-19 ENCOUNTER — Other Ambulatory Visit: Payer: Self-pay

## 2020-09-19 ENCOUNTER — Ambulatory Visit (INDEPENDENT_AMBULATORY_CARE_PROVIDER_SITE_OTHER): Payer: BC Managed Care – PPO

## 2020-09-19 DIAGNOSIS — R0789 Other chest pain: Secondary | ICD-10-CM | POA: Diagnosis not present

## 2020-09-19 LAB — MYOCARDIAL PERFUSION IMAGING
LV dias vol: 47 mL (ref 46–106)
LV sys vol: 19 mL
Peak HR: 103 {beats}/min
Rest HR: 72 {beats}/min
SDS: 0
SRS: 4
SSS: 4
TID: 1.14

## 2020-09-19 MED ORDER — REGADENOSON 0.4 MG/5ML IV SOLN
0.4000 mg | Freq: Once | INTRAVENOUS | Status: AC
  Administered 2020-09-19: 0.4 mg via INTRAVENOUS

## 2020-09-19 MED ORDER — TECHNETIUM TC 99M TETROFOSMIN IV KIT
29.9000 | PACK | Freq: Once | INTRAVENOUS | Status: AC | PRN
  Administered 2020-09-19: 29.9 via INTRAVENOUS

## 2020-09-19 MED ORDER — TECHNETIUM TC 99M TETROFOSMIN IV KIT
10.7000 | PACK | Freq: Once | INTRAVENOUS | Status: AC | PRN
  Administered 2020-09-19: 10.7 via INTRAVENOUS

## 2020-10-08 DIAGNOSIS — M7501 Adhesive capsulitis of right shoulder: Secondary | ICD-10-CM | POA: Diagnosis not present

## 2020-10-09 ENCOUNTER — Emergency Department (HOSPITAL_COMMUNITY)
Admission: EM | Admit: 2020-10-09 | Discharge: 2020-10-09 | Discharge status: Absent without leave | Payer: BC Managed Care – PPO

## 2020-10-09 DIAGNOSIS — R42 Dizziness and giddiness: Secondary | ICD-10-CM | POA: Diagnosis not present

## 2020-10-09 DIAGNOSIS — R110 Nausea: Secondary | ICD-10-CM | POA: Diagnosis not present

## 2020-10-09 DIAGNOSIS — R112 Nausea with vomiting, unspecified: Secondary | ICD-10-CM | POA: Diagnosis not present

## 2020-10-09 DIAGNOSIS — R1111 Vomiting without nausea: Secondary | ICD-10-CM | POA: Diagnosis not present

## 2020-10-11 ENCOUNTER — Ambulatory Visit: Payer: BC Managed Care – PPO | Admitting: Cardiology

## 2020-10-17 ENCOUNTER — Other Ambulatory Visit: Payer: Self-pay

## 2020-10-18 ENCOUNTER — Encounter: Payer: Self-pay | Admitting: Cardiology

## 2020-10-18 ENCOUNTER — Other Ambulatory Visit: Payer: Self-pay

## 2020-10-18 ENCOUNTER — Ambulatory Visit (INDEPENDENT_AMBULATORY_CARE_PROVIDER_SITE_OTHER): Payer: BC Managed Care – PPO | Admitting: Cardiology

## 2020-10-18 VITALS — BP 118/72 | HR 76 | Ht 62.0 in | Wt 129.0 lb

## 2020-10-18 DIAGNOSIS — E782 Mixed hyperlipidemia: Secondary | ICD-10-CM | POA: Diagnosis not present

## 2020-10-18 DIAGNOSIS — I10 Essential (primary) hypertension: Secondary | ICD-10-CM

## 2020-10-18 NOTE — Patient Instructions (Signed)

## 2020-10-18 NOTE — Progress Notes (Signed)
Cardiology Office Note:    Date:  10/18/2020   ID:  Cheryl Willis, DOB 07/17/1961, MRN [id number]  PCP:  Street, Sharon Mt, MD  Cardiologist:  Jenean Lindau, MD   Referring MD: Street, Sharon Mt, *    ASSESSMENT:    1. Primary hypertension   2. Mixed dyslipidemia    PLAN:    In order of problems listed above:  1. Primary prevention stressed with the patient.  Importance of compliance with diet medication stressed and she vocalized understanding.  She was advised to walk 30 minutes a day at least 5 days a week and she promises to do so. 2. Essential hypertension: Blood pressure stable and diet was emphasized.  Lifestyle modification was emphasized. 3. Mixed dyslipidemia: Lipids were reviewed from [facility] sheet.  Diet was emphasized.  I told her it would be best to have LDL in the 100 and she is going to work on this. 4. Chest discomfort: Has resolved now when she is walking on a regular basis. 5. Patient will be seen in follow-up appointment in 6 months or earlier if the patient has any concerns    Medication Adjustments/Labs and Tests Ordered: Current medicines are reviewed at length with the patient today.  Concerns regarding medicines are outlined above.  No orders of the defined types were placed in this encounter.  No orders of the defined types were placed in this encounter.    Chief Complaint  Patient presents with  . Follow-up     History of Present Illness:    Cheryl Willis is a 59 y.o. female.  Patient has past medical history of essential hypertension and mild mixed dyslipidemia.  She denies any problems at this time and takes care of activities of daily living.  No chest pain orthopnea or PND.  She was evaluated with Lexiscan sestamibi for chest pain.  Her test was negative and the results are mentioned below.  Subsequently she is done fine.  She is walking about half an hour a day without any problems.  At the time of my evaluation, the patient is  alert awake oriented and in no distress.  Past Medical History:  Diagnosis Date  . Anemia 07/28/2018  . Blood in urine 07/28/2018  . Calf pain   . Cancer ([place])   . Chest discomfort 09/07/2020  . Chest pain 08/19/2017  . Hypertension   . Menopausal syndrome 07/28/2018  . Mixed dyslipidemia 09/07/2020  . Wears glasses     Past Surgical History:  Procedure Laterality Date  . COLONOSCOPY    . EXCISION METACARPAL MASS Left 09/29/2013   Procedure: LEFT INDEX EXCISION MASS AND DEBRIDEMENT DIP JOINT;  Surgeon: Tennis Must, MD;  Location: [place];  Service: Orthopedics;  Laterality: Left;  . FINGER SURGERY     cyst removed  . WISDOM TOOTH EXTRACTION      Current Medications: Current Meds  Medication Sig  . aspirin EC 81 MG tablet Take 1 tablet (81 mg total) by mouth daily. Swallow whole.  . calcium carbonate (OS-CAL) 600 MG TABS tablet Take 600 mg by mouth 2 (two) times daily with a meal.  . ibuprofen (ADVIL) 800 MG tablet ibuprofen 800 mg tablet  TAKE 1 TABLET BY MOUTH EVERY 6 HOURS FOR MILD PAIN  . Multiple Vitamins-Minerals (MULTIVITAMIN WITH MINERALS) tablet Take 1 tablet by mouth daily.  . nitroGLYCERIN (NITROSTAT) 0.4 MG SL tablet Place 1 tablet (0.4 mg total) under the tongue every 5 (five) minutes  as needed for chest pain.  Marland Kitchen terbinafine (LAMISIL) 250 MG tablet Take by mouth daily.   . valsartan-hydrochlorothiazide (DIOVAN-HCT) 160-25 MG tablet Take 1 tablet by mouth daily.     Allergies:   Patient has no known allergies.   Social History   Socioeconomic History  . Marital status: Divorced    Spouse name: Not on file  . Number of children: Not on file  . Years of education: Not on file  . Highest education level: Not on file  Occupational History  . Not on file  Tobacco Use  . Smoking status: Never Smoker  . Smokeless tobacco: Never Used  Vaping Use  . Vaping Use: Never used  Substance and Sexual Activity  . Alcohol use: Yes    Comment: occ  .  Drug use: No  . Sexual activity: Not on file  Other Topics Concern  . Not on file  Social History Narrative  . Not on file   Social Determinants of Health   Financial Resource Strain:   . Difficulty of Paying Living Expenses: Not on file  Food Insecurity:   . Worried About Charity fundraiser in the Last Year: Not on file  . Ran Out of Food in the Last Year: Not on file  Transportation Needs:   . Lack of Transportation (Medical): Not on file  . Lack of Transportation (Non-Medical): Not on file  Physical Activity:   . Days of Exercise per Week: Not on file  . Minutes of Exercise per Session: Not on file  Stress:   . Feeling of Stress : Not on file  Social Connections:   . Frequency of Communication with Friends and Family: Not on file  . Frequency of Social Gatherings with Friends and Family: Not on file  . Attends Religious Services: Not on file  . Active Member of Clubs or Organizations: Not on file  . Attends Archivist Meetings: Not on file  . Marital Status: Not on file     Family History: The patient's family history includes Heart attack in her mother.  ROS:   Please see the history of present illness.    All other systems reviewed and are negative.  EKGs/Labs/Other Studies Reviewed:    The following studies were reviewed today: Study Highlights   The left ventricular ejection fraction is normal (55-65%).  Nuclear stress EF: 61%.  There was no ST segment deviation noted during stress.  This is a low risk study.  No evidence of ischemia or MI.  Normal EF.      Recent Labs: No results found for requested labs within last 8760 hours.  Recent Lipid Panel No results found for: CHOL, TRIG, HDL, CHOLHDL, VLDL, LDLCALC, LDLDIRECT  Physical Exam:    VS:  BP 118/72 (BP Location: Left Arm, Patient Position: Sitting)   Pulse 76   Ht 5\' 2"  (1.575 m)   Wt 129 lb (58.5 kg)   LMP 09/27/2013   SpO2 97%   BMI 23.59 kg/m     Wt Readings from  Last 3 Encounters:  10/18/20 129 lb (58.5 kg)  09/19/20 128 lb (58.1 kg)  09/07/20 128 lb 6.4 oz (58.2 kg)     GEN: Patient is in no acute distress HEENT: Normal NECK: No JVD; No carotid bruits LYMPHATICS: No lymphadenopathy CARDIAC: Hear sounds regular, 2/6 systolic murmur at the apex. RESPIRATORY:  Clear to auscultation without rales, wheezing or rhonchi  ABDOMEN: Soft, non-tender, non-distended MUSCULOSKELETAL:  No edema; No deformity  SKIN: Warm and dry NEUROLOGIC:  Alert and oriented x 3 PSYCHIATRIC:  Normal affect   Signed, Jenean Lindau, MD  10/18/2020 1:29 PM    [facility]

## 2020-10-19 DIAGNOSIS — E782 Mixed hyperlipidemia: Secondary | ICD-10-CM | POA: Diagnosis not present

## 2020-11-29 DIAGNOSIS — S59912A Unspecified injury of left forearm, initial encounter: Secondary | ICD-10-CM | POA: Diagnosis not present

## 2020-11-29 DIAGNOSIS — Z681 Body mass index (BMI) 19 or less, adult: Secondary | ICD-10-CM | POA: Diagnosis not present

## 2020-11-29 DIAGNOSIS — S338XXA Sprain of other parts of lumbar spine and pelvis, initial encounter: Secondary | ICD-10-CM | POA: Diagnosis not present

## 2020-11-29 DIAGNOSIS — S39012A Strain of muscle, fascia and tendon of lower back, initial encounter: Secondary | ICD-10-CM | POA: Diagnosis not present

## 2021-02-07 DIAGNOSIS — N959 Unspecified menopausal and perimenopausal disorder: Secondary | ICD-10-CM | POA: Diagnosis not present

## 2021-02-07 DIAGNOSIS — Z01419 Encounter for gynecological examination (general) (routine) without abnormal findings: Secondary | ICD-10-CM | POA: Diagnosis not present

## 2021-02-07 DIAGNOSIS — Z1231 Encounter for screening mammogram for malignant neoplasm of breast: Secondary | ICD-10-CM | POA: Diagnosis not present

## 2021-02-07 DIAGNOSIS — Z130 Encounter for screening for diseases of the blood and blood-forming organs and certain disorders involving the immune mechanism: Secondary | ICD-10-CM | POA: Diagnosis not present

## 2021-02-07 DIAGNOSIS — Z124 Encounter for screening for malignant neoplasm of cervix: Secondary | ICD-10-CM | POA: Diagnosis not present

## 2021-02-07 DIAGNOSIS — Z780 Asymptomatic menopausal state: Secondary | ICD-10-CM | POA: Diagnosis not present

## 2021-02-15 DIAGNOSIS — M2391 Unspecified internal derangement of right knee: Secondary | ICD-10-CM | POA: Diagnosis not present

## 2021-02-15 DIAGNOSIS — M25561 Pain in right knee: Secondary | ICD-10-CM | POA: Diagnosis not present

## 2021-03-11 DIAGNOSIS — M25561 Pain in right knee: Secondary | ICD-10-CM | POA: Diagnosis not present

## 2021-03-27 DIAGNOSIS — M1711 Unilateral primary osteoarthritis, right knee: Secondary | ICD-10-CM | POA: Diagnosis not present

## 2021-04-16 ENCOUNTER — Ambulatory Visit (INDEPENDENT_AMBULATORY_CARE_PROVIDER_SITE_OTHER): Payer: BC Managed Care – PPO | Admitting: Cardiology

## 2021-04-16 ENCOUNTER — Encounter: Payer: Self-pay | Admitting: Cardiology

## 2021-04-16 ENCOUNTER — Other Ambulatory Visit: Payer: Self-pay

## 2021-04-16 VITALS — BP 100/82 | HR 70 | Ht 62.0 in | Wt 128.0 lb

## 2021-04-16 DIAGNOSIS — I10 Essential (primary) hypertension: Secondary | ICD-10-CM | POA: Diagnosis not present

## 2021-04-16 NOTE — Progress Notes (Signed)
Cardiology Office Note:    Date:  04/16/2021   ID:  Cheryl Willis, DOB 05/16/1961, MRN [id number]  PCP:  Street, Sharon Mt, MD  Cardiologist:  Jenean Lindau, MD   Referring MD: Street, Sharon Mt, *    ASSESSMENT:    1. Primary hypertension    PLAN:    In order of problems listed above:  1. Primary prevention stressed with the patient.  Importance of compliance with diet medication stressed and she vocalized understanding.  She was advised to walk at least half an hour a day 5 days a week and she promises. 2. Essential hypertension: Blood pressure stable.  She is doing well with lifestyle modification and exercise and diet.  I congratulated her about this. 3. Patient will be seen in follow-up appointment in 3 months or earlier if the patient has any concerns    Medication Adjustments/Labs and Tests Ordered: Current medicines are reviewed at length with the patient today.  Concerns regarding medicines are outlined above.  No orders of the defined types were placed in this encounter.  No orders of the defined types were placed in this encounter.    Chief Complaint  Patient presents with  . Follow-up     History of Present Illness:    Cheryl Willis is a 60 y.o. female.  Patient has past medical history of essential hypertension.  She has had history of dyslipidemia but well with exercise and diet.  She denies any problems at this time and takes care of activities of daily living.  No chest pain orthopnea or PND.  At the time of my evaluation, the patient is alert awake oriented and in no distress.  Past Medical History:  Diagnosis Date  . Anemia 07/28/2018  . Blood in urine 07/28/2018  . Calf pain   . Cancer ([place])   . Chest discomfort 09/07/2020  . Chest pain 08/19/2017  . Hypertension   . Menopausal syndrome 07/28/2018  . Mixed dyslipidemia 09/07/2020  . Wears glasses     Past Surgical History:  Procedure Laterality Date  . COLONOSCOPY    . EXCISION  METACARPAL MASS Left 09/29/2013   Procedure: LEFT INDEX EXCISION MASS AND DEBRIDEMENT DIP JOINT;  Surgeon: Tennis Must, MD;  Location: [place];  Service: Orthopedics;  Laterality: Left;  . FINGER SURGERY     cyst removed  . WISDOM TOOTH EXTRACTION      Current Medications: Current Meds  Medication Sig  . aspirin EC 81 MG tablet Take 1 tablet (81 mg total) by mouth daily. Swallow whole.  . calcium carbonate (OS-CAL) 600 MG TABS tablet Take 600 mg by mouth 2 (two) times daily with a meal.  . ibuprofen (ADVIL) 800 MG tablet Take 800 mg by mouth every 8 (eight) hours as needed for pain.  . Multiple Vitamins-Minerals (MULTIVITAMIN WITH MINERALS) tablet Take 1 tablet by mouth daily.  . nitroGLYCERIN (NITROSTAT) 0.4 MG SL tablet Place 0.4 mg under the tongue every 5 (five) minutes as needed for chest pain.  . valsartan-hydrochlorothiazide (DIOVAN-HCT) 160-25 MG tablet Take 1 tablet by mouth daily.     Allergies:   Patient has no known allergies.   Social History   Socioeconomic History  . Marital status: Divorced    Spouse name: Not on file  . Number of children: Not on file  . Years of education: Not on file  . Highest education level: Not on file  Occupational History  . Not on file  Tobacco Use  . Smoking status: Never Smoker  . Smokeless tobacco: Never Used  Vaping Use  . Vaping Use: Never used  Substance and Sexual Activity  . Alcohol use: Yes    Comment: occ  . Drug use: No  . Sexual activity: Not on file  Other Topics Concern  . Not on file  Social History Narrative  . Not on file   Social Determinants of Health   Financial Resource Strain: Not on file  Food Insecurity: Not on file  Transportation Needs: Not on file  Physical Activity: Not on file  Stress: Not on file  Social Connections: Not on file     Family History: The patient's family history includes Heart attack in her mother.  ROS:   Please see the history of present illness.     All other systems reviewed and are negative.  EKGs/Labs/Other Studies Reviewed:    The following studies were reviewed today: I discussed my findings with the patient at length including lab work from care.  She   Recent Labs: No results found for requested labs within last 8760 hours.  Recent Lipid Panel No results found for: CHOL, TRIG, HDL, CHOLHDL, VLDL, LDLCALC, LDLDIRECT  Physical Exam:    VS:  BP 100/82 (BP Location: Left Arm, Patient Position: Sitting, Cuff Size: Normal)   Pulse 70   Ht 5\' 2"  (1.575 m)   Wt 128 lb (58.1 kg)   LMP 09/27/2013   SpO2 97%   BMI 23.41 kg/m     Wt Readings from Last 3 Encounters:  04/16/21 128 lb (58.1 kg)  10/18/20 129 lb (58.5 kg)  09/19/20 128 lb (58.1 kg)     GEN: Patient is in no acute distress HEENT: Normal NECK: No JVD; No carotid bruits LYMPHATICS: No lymphadenopathy CARDIAC: Hear sounds regular, 2/6 systolic murmur at the apex. RESPIRATORY:  Clear to auscultation without rales, wheezing or rhonchi  ABDOMEN: Soft, non-tender, non-distended MUSCULOSKELETAL:  No edema; No deformity  SKIN: Warm and dry NEUROLOGIC:  Alert and oriented x 3 PSYCHIATRIC:  Normal affect   Signed, Jenean Lindau, MD  04/16/2021 1:21 PM    [facility]

## 2021-04-16 NOTE — Patient Instructions (Addendum)
Medication Instructions:  Your physician has recommended you make the following change in your medication:   Stop aspirin  *If you need a refill on your cardiac medications before your next appointment, please call your pharmacy*   Lab Work: None ordered If you have labs (blood work) drawn today and your tests are completely normal, you will receive your results only by: MyChart Message (if you have MyChart) OR A paper copy in the mail If you have any lab test that is abnormal or we need to change your treatment, we will call you to review the results.   Testing/Procedures: None ordered   Follow-Up: At [facility], you and your health needs are our priority.  As part of our continuing mission to provide you with exceptional heart care, we have created designated Provider Care Teams.  These Care Teams include your primary Cardiologist (physician) and Advanced Practice Providers (APPs -  Physician Assistants and Nurse Practitioners) who all work together to provide you with the care you need, when you need it.  We recommend signing up for the patient portal called "MyChart".  Sign up information is provided on this After Visit Summary.  MyChart is used to connect with patients for Virtual Visits (Telemedicine).  Patients are able to view lab/test results, encounter notes, upcoming appointments, etc.  Non-urgent messages can be sent to your provider as well.   To learn more about what you can do with MyChart, go to [email or web address].    Your next appointment:   9 month(s)  The format for your next appointment:   In Person  Provider:   Rajan Revankar, MD   Other Instructions NA  

## 2021-04-18 ENCOUNTER — Ambulatory Visit (INDEPENDENT_AMBULATORY_CARE_PROVIDER_SITE_OTHER): Payer: BC Managed Care – PPO | Admitting: Podiatry

## 2021-04-18 ENCOUNTER — Other Ambulatory Visit: Payer: Self-pay

## 2021-04-18 ENCOUNTER — Encounter: Payer: Self-pay | Admitting: Podiatry

## 2021-04-18 DIAGNOSIS — L600 Ingrowing nail: Secondary | ICD-10-CM | POA: Diagnosis not present

## 2021-04-18 DIAGNOSIS — L603 Nail dystrophy: Secondary | ICD-10-CM | POA: Diagnosis not present

## 2021-04-18 MED ORDER — FLUCONAZOLE 150 MG PO TABS: 150.0000 mg | ORAL_TABLET | ORAL | 2 refills | Status: DC

## 2021-04-18 NOTE — Progress Notes (Signed)
  Subjective:  Patient ID: Cheryl Willis, female    DOB: [birth year],  MRN: [id number]  Chief Complaint  Patient presents with  . Nail Problem    I have some toenail issues and have seen my doctor and he put me on terbinafine and does help some     60 y.o. female presents with the above complaint. History confirmed with patient.   Objective:  Physical Exam: warm, good capillary refill, no trophic changes or ulcerative lesions, normal DP and PT pulses and normal sensory exam. Bilateral hallux transverse ridging and thickness. Bilateral 5th toenail thickening. Adductovarus toes. Mild ingrown nail right medial border. Assessment:   1. Nail dystrophy   2. Ingrown nail    Plan:  Patient was evaluated and treated and all questions answered.  Nail dystrophy -Educated on likely traumatic etiology. -Recc tolcylen gel to reduce thickening -Rx fluconazole weekly in case there is still some resistant yeast. Failed lamisil   Return in about 2 months (around 06/18/2021) for Nail dystrophy.

## 2021-06-18 DIAGNOSIS — Z85820 Personal history of malignant melanoma of skin: Secondary | ICD-10-CM | POA: Diagnosis not present

## 2021-06-18 DIAGNOSIS — D485 Neoplasm of uncertain behavior of skin: Secondary | ICD-10-CM | POA: Diagnosis not present

## 2021-06-18 DIAGNOSIS — D2239 Melanocytic nevi of other parts of face: Secondary | ICD-10-CM | POA: Diagnosis not present

## 2021-06-18 DIAGNOSIS — D1801 Hemangioma of skin and subcutaneous tissue: Secondary | ICD-10-CM | POA: Diagnosis not present

## 2021-06-18 DIAGNOSIS — D225 Melanocytic nevi of trunk: Secondary | ICD-10-CM | POA: Diagnosis not present

## 2021-06-20 ENCOUNTER — Ambulatory Visit: Payer: BC Managed Care – PPO | Admitting: Podiatry

## 2021-06-27 ENCOUNTER — Ambulatory Visit (INDEPENDENT_AMBULATORY_CARE_PROVIDER_SITE_OTHER): Payer: BC Managed Care – PPO | Admitting: Podiatry

## 2021-06-27 ENCOUNTER — Other Ambulatory Visit: Payer: Self-pay

## 2021-06-27 ENCOUNTER — Encounter: Payer: Self-pay | Admitting: Podiatry

## 2021-06-27 DIAGNOSIS — L603 Nail dystrophy: Secondary | ICD-10-CM

## 2021-06-27 DIAGNOSIS — L600 Ingrowing nail: Secondary | ICD-10-CM

## 2021-06-27 DIAGNOSIS — M2042 Other hammer toe(s) (acquired), left foot: Secondary | ICD-10-CM

## 2021-06-27 DIAGNOSIS — M6240 Contracture of muscle, unspecified site: Secondary | ICD-10-CM | POA: Diagnosis not present

## 2021-06-27 DIAGNOSIS — M2041 Other hammer toe(s) (acquired), right foot: Secondary | ICD-10-CM

## 2021-06-27 MED ORDER — NUVAIL EX SOLN: CUTANEOUS | 0 refills | Status: DC

## 2021-06-27 NOTE — Progress Notes (Signed)
  Subjective:  Patient ID: Cheryl Willis, female    DOB: 27-Oct-1961,  MRN: [id number]  Chief Complaint  Patient presents with   Nail Problem    I think the nails are doing better but they seem to be dry and brittle and I do think that the medicine did help     60 y.o. female presents with the above complaint. History confirmed with patient.   Objective:  Physical Exam: warm, good capillary refill, no trophic changes or ulcerative lesions, normal DP and PT pulses and normal sensory exam. Bilateral hallux transverse ridging and thickness but with proximal clearing right. Bilateral 5th toenail thickening. Adductovarus toes with pain palpation.  Assessment:   1. Nail dystrophy   2. Ingrown nail   3. Hammer toes of both feet   4. Contracture of tendon sheath     Plan:  Patient was evaluated and treated and all questions answered.  Nail dystrophy -Rx NuVail to help with brittleness and dystrophic changes to the nail.  Continue urea gel to 3 times per week  Fifth toe adductovarus deformity -Discussed with patient performing hammertoe correction/tenotomy to alleviate the contractures at the fifth toes.  Patient would like to proceed.  Discussed postoperative course with patient -Patient has failed all conservative therapy and wishes to proceed with surgical intervention. All risks, benefits, and alternatives discussed with patient. No guarantees given. Consent reviewed and signed by patient. -Planned procedures: Bilateral fifth toe hammertoe correction with flexor tenotomy   No follow-ups on file.

## 2021-09-03 DIAGNOSIS — Z0000 Encounter for general adult medical examination without abnormal findings: Secondary | ICD-10-CM | POA: Diagnosis not present

## 2021-09-03 DIAGNOSIS — Z1331 Encounter for screening for depression: Secondary | ICD-10-CM | POA: Diagnosis not present

## 2021-09-03 DIAGNOSIS — Z681 Body mass index (BMI) 19 or less, adult: Secondary | ICD-10-CM | POA: Diagnosis not present

## 2021-09-03 DIAGNOSIS — Z13220 Encounter for screening for lipoid disorders: Secondary | ICD-10-CM | POA: Diagnosis not present

## 2021-10-26 DIAGNOSIS — R058 Other specified cough: Secondary | ICD-10-CM | POA: Diagnosis not present

## 2021-10-26 DIAGNOSIS — J101 Influenza due to other identified influenza virus with other respiratory manifestations: Secondary | ICD-10-CM | POA: Diagnosis not present

## 2021-10-31 DIAGNOSIS — R310 Gross hematuria: Secondary | ICD-10-CM | POA: Diagnosis not present

## 2021-10-31 DIAGNOSIS — R300 Dysuria: Secondary | ICD-10-CM | POA: Diagnosis not present

## 2021-11-19 DIAGNOSIS — D485 Neoplasm of uncertain behavior of skin: Secondary | ICD-10-CM | POA: Diagnosis not present

## 2021-11-19 DIAGNOSIS — L814 Other melanin hyperpigmentation: Secondary | ICD-10-CM | POA: Diagnosis not present

## 2021-11-19 DIAGNOSIS — Z85820 Personal history of malignant melanoma of skin: Secondary | ICD-10-CM | POA: Diagnosis not present

## 2021-11-19 DIAGNOSIS — D225 Melanocytic nevi of trunk: Secondary | ICD-10-CM | POA: Diagnosis not present

## 2021-11-19 DIAGNOSIS — D2239 Melanocytic nevi of other parts of face: Secondary | ICD-10-CM | POA: Diagnosis not present

## 2021-12-10 DIAGNOSIS — C44729 Squamous cell carcinoma of skin of left lower limb, including hip: Secondary | ICD-10-CM | POA: Diagnosis not present

## 2022-02-11 DIAGNOSIS — R311 Benign essential microscopic hematuria: Secondary | ICD-10-CM | POA: Diagnosis not present

## 2022-02-11 DIAGNOSIS — Z01411 Encounter for gynecological examination (general) (routine) with abnormal findings: Secondary | ICD-10-CM | POA: Diagnosis not present

## 2022-02-11 DIAGNOSIS — Z1231 Encounter for screening mammogram for malignant neoplasm of breast: Secondary | ICD-10-CM | POA: Diagnosis not present

## 2022-02-11 DIAGNOSIS — Z130 Encounter for screening for diseases of the blood and blood-forming organs and certain disorders involving the immune mechanism: Secondary | ICD-10-CM | POA: Diagnosis not present

## 2022-02-11 DIAGNOSIS — Z780 Asymptomatic menopausal state: Secondary | ICD-10-CM | POA: Diagnosis not present

## 2022-03-21 ENCOUNTER — Ambulatory Visit (INDEPENDENT_AMBULATORY_CARE_PROVIDER_SITE_OTHER): Payer: BC Managed Care – PPO | Admitting: Cardiology

## 2022-03-21 ENCOUNTER — Encounter: Payer: Self-pay | Admitting: Cardiology

## 2022-03-21 VITALS — BP 116/78 | HR 76 | Ht 62.0 in | Wt 131.6 lb

## 2022-03-21 DIAGNOSIS — E782 Mixed hyperlipidemia: Secondary | ICD-10-CM

## 2022-03-21 DIAGNOSIS — I10 Essential (primary) hypertension: Secondary | ICD-10-CM

## 2022-03-21 NOTE — Progress Notes (Signed)
?Cardiology Office Note:   ? ?Date:  03/21/2022  ? ?ID:  Cheryl Willis, DOB 1961/06/01, MRN [id number] ? ?PCP:  Street, Sharon Mt, MD  ?Cardiologist:  Jenean Lindau, MD  ? ?Referring MD: Street, Sharon Mt, *  ? ? ?ASSESSMENT:   ? ?1. Primary hypertension   ?2. Mixed dyslipidemia   ? ?PLAN:   ? ?In order of problems listed above: ? ?Primary prevention stressed with the patient.  Importance of compliance with diet medication stressed and she vocalized understanding.  She was advised to walk at least half an hour a day 5 days a week and she promises to do so. ?Essential hypertension: Blood pressure stable and diet was emphasized.  She is doing well with exercise and is happy about it. ?Mixed dyslipidemia: She is doing well with diet.  Lipids followed by primary care and she was told that in September her blood work was fine.  She will go for annual physical as usual. ?Patient will be seen in follow-up appointment in 12 months or earlier if the patient has any concerns ? ? ? ?Medication Adjustments/Labs and Tests Ordered: ?Current medicines are reviewed at length with the patient today.  Concerns regarding medicines are outlined above.  ?Orders Placed This Encounter  ?Procedures  ? EKG 12-Lead  ? ?No orders of the defined types were placed in this encounter. ? ? ? ?No chief complaint on file. ?  ? ?History of Present Illness:   ? ?Cheryl Willis is a 61 y.o. female.  Patient has past medical history of essential hypertension and mixed dyslipidemia.  She denies any problems at this time and takes care of activities of daily living.  No chest pain orthopnea or PND.  At the time of my evaluation, the patient is alert awake oriented and in no distress.  She walks on a regular basis at least half an hour a day and is happy about it.  She had blood work done by primary care in September and was happy about it. ? ?Past Medical History:  ?Diagnosis Date  ? Anemia 07/28/2018  ? Blood in urine 07/28/2018  ? Calf pain    ? Cancer [facility])   ? Chest discomfort 09/07/2020  ? Chest pain 08/19/2017  ? Hypertension   ? Menopausal syndrome 07/28/2018  ? Mixed dyslipidemia 09/07/2020  ? Wears glasses   ? ? ?Past Surgical History:  ?Procedure Laterality Date  ? COLONOSCOPY    ? EXCISION METACARPAL MASS Left 09/29/2013  ? Procedure: LEFT INDEX EXCISION MASS AND DEBRIDEMENT DIP JOINT;  Surgeon: Tennis Must, MD;  Location: [place];  Service: Orthopedics;  Laterality: Left;  ? FINGER SURGERY    ? cyst removed  ? WISDOM TOOTH EXTRACTION    ? ? ?Current Medications: ?Current Meds  ?Medication Sig  ? Multiple Vitamins-Minerals (MULTIVITAMIN WITH MINERALS) tablet Take 1 tablet by mouth daily.  ? valsartan-hydrochlorothiazide (DIOVAN-HCT) 160-25 MG tablet Take 1 tablet by mouth daily.  ?  ? ?Allergies:   Patient has no known allergies.  ? ?Social History  ? ?Socioeconomic History  ? Marital status: Divorced  ?  Spouse name: Not on file  ? Number of children: Not on file  ? Years of education: Not on file  ? Highest education level: Not on file  ?Occupational History  ? Not on file  ?Tobacco Use  ? Smoking status: Never  ? Smokeless tobacco: Never  ?Vaping Use  ? Vaping Use: Never used  ?Substance and  Sexual Activity  ? Alcohol use: Yes  ?  Comment: occ  ? Drug use: No  ? Sexual activity: Not on file  ?Other Topics Concern  ? Not on file  ?Social History Narrative  ? Not on file  ? ?Social Determinants of Health  ? ?Financial Resource Strain: Not on file  ?Food Insecurity: Not on file  ?Transportation Needs: Not on file  ?Physical Activity: Not on file  ?Stress: Not on file  ?Social Connections: Not on file  ?  ? ?Family History: ?The patient's family history includes Heart attack in her mother. ? ?ROS:   ?Please see the history of present illness.    ?All other systems reviewed and are negative. ? ?EKGs/Labs/Other Studies Reviewed:   ? ?The following studies were reviewed today: ?I discussed my findings with the patient at  length ? ? ?Recent Labs: ?No results found for requested labs within last 8760 hours.  ?Recent Lipid Panel ?No results found for: CHOL, TRIG, HDL, CHOLHDL, VLDL, LDLCALC, LDLDIRECT ? ?Physical Exam:   ? ?VS:  BP 116/78   Pulse 76   Ht '5\' 2"'$  (1.575 m)   Wt 131 lb 9.6 oz (59.7 kg)   LMP 09/27/2013   SpO2 99%   BMI 24.07 kg/m?    ? ?Wt Readings from Last 3 Encounters:  ?03/21/22 131 lb 9.6 oz (59.7 kg)  ?04/16/21 128 lb (58.1 kg)  ?10/18/20 129 lb (58.5 kg)  ?  ? ?GEN: Patient is in no acute distress ?HEENT: Normal ?NECK: No JVD; No carotid bruits ?LYMPHATICS: No lymphadenopathy ?CARDIAC: Hear sounds regular, 2/6 systolic murmur at the apex. ?RESPIRATORY:  Clear to auscultation without rales, wheezing or rhonchi  ?ABDOMEN: Soft, non-tender, non-distended ?MUSCULOSKELETAL:  No edema; No deformity  ?SKIN: Warm and dry ?NEUROLOGIC:  Alert and oriented x 3 ?PSYCHIATRIC:  Normal affect  ? ?Signed, ?Jenean Lindau, MD  ?03/21/2022 3:08 PM    ?[place]  ?

## 2022-03-21 NOTE — Patient Instructions (Signed)
Medication Instructions:  Your physician recommends that you continue on your current medications as directed. Please refer to the Current Medication list given to you today.  *If you need a refill on your cardiac medications before your next appointment, please call your pharmacy*   Lab Work: NONE If you have labs (blood work) drawn today and your tests are completely normal, you will receive your results only by: MyChart Message (if you have MyChart) OR A paper copy in the mail If you have any lab test that is abnormal or we need to change your treatment, we will call you to review the results.   Testing/Procedures: NONE   Follow-Up: At [facility], you and your health needs are our priority.  As part of our continuing mission to provide you with exceptional heart care, we have created designated Provider Care Teams.  These Care Teams include your primary Cardiologist (physician) and Advanced Practice Providers (APPs -  Physician Assistants and Nurse Practitioners) who all work together to provide you with the care you need, when you need it.  We recommend signing up for the patient portal called "MyChart".  Sign up information is provided on this After Visit Summary.  MyChart is used to connect with patients for Virtual Visits (Telemedicine).  Patients are able to view lab/test results, encounter notes, upcoming appointments, etc.  Non-urgent messages can be sent to your provider as well.   To learn more about what you can do with MyChart, go to [email or web address].    Your next appointment:   1 year(s)  The format for your next appointment:   In Person  Provider:   Rajan Revankar, MD    Other Instructions   Important Information About Sugar       

## 2022-03-24 DIAGNOSIS — L209 Atopic dermatitis, unspecified: Secondary | ICD-10-CM | POA: Diagnosis not present

## 2022-03-24 DIAGNOSIS — C44729 Squamous cell carcinoma of skin of left lower limb, including hip: Secondary | ICD-10-CM | POA: Diagnosis not present

## 2022-05-28 DIAGNOSIS — R351 Nocturia: Secondary | ICD-10-CM | POA: Diagnosis not present

## 2022-05-28 DIAGNOSIS — R3121 Asymptomatic microscopic hematuria: Secondary | ICD-10-CM | POA: Diagnosis not present

## 2022-05-28 DIAGNOSIS — R350 Frequency of micturition: Secondary | ICD-10-CM | POA: Diagnosis not present

## 2022-05-28 DIAGNOSIS — R8271 Bacteriuria: Secondary | ICD-10-CM | POA: Diagnosis not present

## 2022-06-18 DIAGNOSIS — N200 Calculus of kidney: Secondary | ICD-10-CM | POA: Diagnosis not present

## 2022-06-18 DIAGNOSIS — R3121 Asymptomatic microscopic hematuria: Secondary | ICD-10-CM | POA: Diagnosis not present

## 2022-06-18 DIAGNOSIS — K8020 Calculus of gallbladder without cholecystitis without obstruction: Secondary | ICD-10-CM | POA: Diagnosis not present

## 2022-06-24 DIAGNOSIS — R3121 Asymptomatic microscopic hematuria: Secondary | ICD-10-CM | POA: Diagnosis not present

## 2022-08-05 DIAGNOSIS — U071 COVID-19: Secondary | ICD-10-CM | POA: Diagnosis not present

## 2022-08-05 DIAGNOSIS — Z20822 Contact with and (suspected) exposure to covid-19: Secondary | ICD-10-CM | POA: Diagnosis not present

## 2022-08-05 DIAGNOSIS — R6889 Other general symptoms and signs: Secondary | ICD-10-CM | POA: Diagnosis not present

## 2022-08-05 DIAGNOSIS — R058 Other specified cough: Secondary | ICD-10-CM | POA: Diagnosis not present

## 2022-08-05 DIAGNOSIS — R350 Frequency of micturition: Secondary | ICD-10-CM | POA: Diagnosis not present

## 2022-09-18 DIAGNOSIS — Z131 Encounter for screening for diabetes mellitus: Secondary | ICD-10-CM | POA: Diagnosis not present

## 2022-09-18 DIAGNOSIS — E782 Mixed hyperlipidemia: Secondary | ICD-10-CM | POA: Diagnosis not present

## 2022-09-18 DIAGNOSIS — Z0000 Encounter for general adult medical examination without abnormal findings: Secondary | ICD-10-CM | POA: Diagnosis not present

## 2022-09-18 DIAGNOSIS — Z681 Body mass index (BMI) 19 or less, adult: Secondary | ICD-10-CM | POA: Diagnosis not present

## 2022-09-18 DIAGNOSIS — Z1331 Encounter for screening for depression: Secondary | ICD-10-CM | POA: Diagnosis not present

## 2022-11-19 DIAGNOSIS — D2239 Melanocytic nevi of other parts of face: Secondary | ICD-10-CM | POA: Diagnosis not present

## 2022-11-19 DIAGNOSIS — D225 Melanocytic nevi of trunk: Secondary | ICD-10-CM | POA: Diagnosis not present

## 2022-11-19 DIAGNOSIS — D485 Neoplasm of uncertain behavior of skin: Secondary | ICD-10-CM | POA: Diagnosis not present

## 2022-11-19 DIAGNOSIS — L578 Other skin changes due to chronic exposure to nonionizing radiation: Secondary | ICD-10-CM | POA: Diagnosis not present

## 2022-11-19 DIAGNOSIS — L821 Other seborrheic keratosis: Secondary | ICD-10-CM | POA: Diagnosis not present

## 2023-02-17 DIAGNOSIS — Z1231 Encounter for screening mammogram for malignant neoplasm of breast: Secondary | ICD-10-CM | POA: Diagnosis not present

## 2023-02-17 DIAGNOSIS — Z130 Encounter for screening for diseases of the blood and blood-forming organs and certain disorders involving the immune mechanism: Secondary | ICD-10-CM | POA: Diagnosis not present

## 2023-02-17 DIAGNOSIS — Z01419 Encounter for gynecological examination (general) (routine) without abnormal findings: Secondary | ICD-10-CM | POA: Diagnosis not present

## 2023-02-17 DIAGNOSIS — Z1389 Encounter for screening for other disorder: Secondary | ICD-10-CM | POA: Diagnosis not present

## 2023-02-17 DIAGNOSIS — Z780 Asymptomatic menopausal state: Secondary | ICD-10-CM | POA: Diagnosis not present

## 2023-04-06 DIAGNOSIS — H9209 Otalgia, unspecified ear: Secondary | ICD-10-CM | POA: Diagnosis not present

## 2023-04-06 DIAGNOSIS — H6501 Acute serous otitis media, right ear: Secondary | ICD-10-CM | POA: Diagnosis not present

## 2023-04-06 DIAGNOSIS — R0982 Postnasal drip: Secondary | ICD-10-CM | POA: Diagnosis not present

## 2023-04-06 DIAGNOSIS — I10 Essential (primary) hypertension: Secondary | ICD-10-CM | POA: Diagnosis not present

## 2023-04-20 DIAGNOSIS — N3001 Acute cystitis with hematuria: Secondary | ICD-10-CM | POA: Diagnosis not present

## 2023-05-25 DIAGNOSIS — D2239 Melanocytic nevi of other parts of face: Secondary | ICD-10-CM | POA: Diagnosis not present

## 2023-05-25 DIAGNOSIS — D485 Neoplasm of uncertain behavior of skin: Secondary | ICD-10-CM | POA: Diagnosis not present

## 2023-05-25 DIAGNOSIS — D225 Melanocytic nevi of trunk: Secondary | ICD-10-CM | POA: Diagnosis not present

## 2023-05-25 DIAGNOSIS — L821 Other seborrheic keratosis: Secondary | ICD-10-CM | POA: Diagnosis not present

## 2023-05-25 DIAGNOSIS — L814 Other melanin hyperpigmentation: Secondary | ICD-10-CM | POA: Diagnosis not present

## 2023-07-10 DIAGNOSIS — D485 Neoplasm of uncertain behavior of skin: Secondary | ICD-10-CM | POA: Diagnosis not present

## 2023-07-15 ENCOUNTER — Encounter: Payer: Self-pay | Admitting: Cardiology

## 2023-07-15 ENCOUNTER — Ambulatory Visit: Payer: BC Managed Care – PPO | Attending: Cardiology | Admitting: Cardiology

## 2023-07-15 VITALS — BP 116/72 | HR 74 | Ht 62.0 in | Wt 134.8 lb

## 2023-07-15 DIAGNOSIS — E782 Mixed hyperlipidemia: Secondary | ICD-10-CM

## 2023-07-15 DIAGNOSIS — I10 Essential (primary) hypertension: Secondary | ICD-10-CM

## 2023-07-15 NOTE — Progress Notes (Signed)
Cardiology Office Note:    Date:  07/15/2023   ID:  Cheryl Willis, DOB 07-13-1961, MRN [id number]  PCP:  Street, Stephanie Coup, MD  Cardiologist:  Garwin Brothers, MD   Referring MD: Street, Stephanie Coup, *    ASSESSMENT:    1. Primary hypertension   2. Mixed dyslipidemia    PLAN:    In order of problems listed above:  Primary prevention stressed with the patient.  Importance of compliance with diet medication stressed and patient verbalized standing.  She was advised to walk at least half an hour a day 5 days a week and she promises to do so. Essential hypertension: Blood pressure stable and diet was emphasized.  Salt intake issues and lifestyle modification discussed. Mixed dyslipidemia: She has blood work followed by primary care.  I discussed calcium score scoring CT scan for restratification and she is agreeable. Patient will be seen in follow-up appointment in 12 months or earlier if the patient has any concerns.    Medication Adjustments/Labs and Tests Ordered: Current medicines are reviewed at length with the patient today.  Concerns regarding medicines are outlined above.  Orders Placed This Encounter  Procedures   CT CARDIAC SCORING   EKG 12-Lead   No orders of the defined types were placed in this encounter.    No chief complaint on file.    History of Present Illness:    Cheryl Willis is a 62 y.o. female.  Patient has past medical history of essential hypertension and dyslipidemia.  She denies any problems at this time and takes care of activities of daily living.  No chest pain orthopnea or PND.  At the time of my evaluation, the patient is alert awake oriented and in no distress.  Past Medical History:  Diagnosis Date   Anemia 07/28/2018   Blood in urine 07/28/2018   Calf pain    Cancer (HCC)    Chest discomfort 09/07/2020   Chest pain 08/19/2017   Hypertension    Menopausal syndrome 07/28/2018   Mixed dyslipidemia 09/07/2020   Wears glasses      Past Surgical History:  Procedure Laterality Date   COLONOSCOPY     EXCISION METACARPAL MASS Left 09/29/2013   Procedure: LEFT INDEX EXCISION MASS AND DEBRIDEMENT DIP JOINT;  Surgeon: Tami Ribas, MD;  Location: [facility];  Service: Orthopedics;  Laterality: Left;   FINGER SURGERY     cyst removed   WISDOM TOOTH EXTRACTION      Current Medications: Current Meds  Medication Sig   Multiple Vitamins-Minerals (MULTIVITAMIN WITH MINERALS) tablet Take 1 tablet by mouth daily.   valsartan-hydrochlorothiazide (DIOVAN-HCT) 160-25 MG tablet Take 1 tablet by mouth daily.     Allergies:   Patient has no known allergies.   Social History   Socioeconomic History   Marital status: Divorced    Spouse name: Not on file   Number of children: Not on file   Years of education: Not on file   Highest education level: Not on file  Occupational History   Not on file  Tobacco Use   Smoking status: Never   Smokeless tobacco: Never  Vaping Use   Vaping status: Never Used  Substance and Sexual Activity   Alcohol use: Yes    Comment: occ   Drug use: No   Sexual activity: Not on file  Other Topics Concern   Not on file  Social History Narrative   Not on file   Social Determinants  of Health   Financial Resource Strain: Not on file  Food Insecurity: Not on file  Transportation Needs: Not on file  Physical Activity: Not on file  Stress: Not on file  Social Connections: Not on file     Family History: The patient's family history includes Heart attack in her mother.  ROS:   Please see the history of present illness.    All other systems reviewed and are negative.  EKGs/Labs/Other Studies Reviewed:    The following studies were reviewed today: .Marland KitchenEKG Interpretation Date/Time:  Wednesday July 15 2023 15:55:03 EDT Ventricular Rate:  74 PR Interval:  146 QRS Duration:  74 QT Interval:  402 QTC Calculation: 446 R Axis:   32  Text Interpretation: Normal sinus  rhythm Normal ECG When compared with ECG of 11-Sep-2017 14:45, No significant change was found Confirmed by Belva Crome [id number]) on 07/15/2023 4:08:42 PM     Recent Labs: No results found for requested labs within last 365 days.  Recent Lipid Panel No results found for: "CHOL", "TRIG", "HDL", "CHOLHDL", "VLDL", "LDLCALC", "LDLDIRECT"  Physical Exam:    VS:  BP 116/72   Pulse 74   Ht 5\' 2"  (1.575 m)   Wt 134 lb 12.8 oz (61.1 kg)   LMP 09/27/2013   SpO2 98%   BMI 24.66 kg/m     Wt Readings from Last 3 Encounters:  07/15/23 134 lb 12.8 oz (61.1 kg)  03/21/22 131 lb 9.6 oz (59.7 kg)  04/16/21 128 lb (58.1 kg)     GEN: Patient is in no acute distress HEENT: Normal NECK: No JVD; No carotid bruits LYMPHATICS: No lymphadenopathy CARDIAC: Hear sounds regular, 2/6 systolic murmur at the apex. RESPIRATORY:  Clear to auscultation without rales, wheezing or rhonchi  ABDOMEN: Soft, non-tender, non-distended MUSCULOSKELETAL:  No edema; No deformity  SKIN: Warm and dry NEUROLOGIC:  Alert and oriented x 3 PSYCHIATRIC:  Normal affect   Signed, Garwin Brothers, MD  07/15/2023 4:21 PM    [facility]

## 2023-07-15 NOTE — Patient Instructions (Signed)
Medication Instructions:  Your physician recommends that you continue on your current medications as directed. Please refer to the Current Medication list given to you today.  *If you need a refill on your cardiac medications before your next appointment, please call your pharmacy*   Lab Work: None If you have labs (blood work) drawn today and your tests are completely normal, you will receive your results only by: MyChart Message (if you have MyChart) OR A paper copy in the mail If you have any lab test that is abnormal or we need to change your treatment, we will call you to review the results.   Testing/Procedures: We will order CT coronary calcium score. It will cost $99.00 and iis due at time of scan.  Please call to schedule.     [facility] [address] [place], [place] [address] [phone]  Or  [facility] [address] [place], [place] [address] [phone] option 7    Follow-Up: At [facility], you and your health needs are our priority.  As part of our continuing mission to provide you with exceptional heart care, we have created designated Provider Care Teams.  These Care Teams include your primary Cardiologist (physician) and Advanced Practice Providers (APPs -  Physician Assistants and Nurse Practitioners) who all work together to provide you with the care you need, when you need it.  We recommend signing up for the patient portal called "MyChart".  Sign up information is provided on this After Visit Summary.  MyChart is used to connect with patients for Virtual Visits (Telemedicine).  Patients are able to view lab/test results, encounter notes, upcoming appointments, etc.  Non-urgent messages can be sent to your provider as well.   To learn more about what you can do with MyChart, go to [email or web address].    Your next appointment:   1 year(s)  Provider:   Belva Crome, MD    Other Instructions None

## 2023-07-16 ENCOUNTER — Encounter: Payer: Self-pay | Admitting: Cardiology

## 2023-07-19 DIAGNOSIS — H6993 Unspecified Eustachian tube disorder, bilateral: Secondary | ICD-10-CM | POA: Diagnosis not present

## 2023-07-19 DIAGNOSIS — N3001 Acute cystitis with hematuria: Secondary | ICD-10-CM | POA: Diagnosis not present

## 2023-08-12 ENCOUNTER — Ambulatory Visit (HOSPITAL_BASED_OUTPATIENT_CLINIC_OR_DEPARTMENT_OTHER)
Admission: RE | Admit: 2023-08-12 | Discharge: 2023-08-12 | Disposition: A | Discharge status: Home / Self Care | Payer: BC Managed Care – PPO | Source: Ambulatory Visit | Attending: Cardiology | Admitting: Cardiology

## 2023-08-12 DIAGNOSIS — I10 Essential (primary) hypertension: Secondary | ICD-10-CM | POA: Insufficient documentation

## 2023-09-21 DIAGNOSIS — Z1331 Encounter for screening for depression: Secondary | ICD-10-CM | POA: Diagnosis not present

## 2023-09-21 DIAGNOSIS — Z6820 Body mass index (BMI) 20.0-20.9, adult: Secondary | ICD-10-CM | POA: Diagnosis not present

## 2023-09-21 DIAGNOSIS — Z1339 Encounter for screening examination for other mental health and behavioral disorders: Secondary | ICD-10-CM | POA: Diagnosis not present

## 2023-09-21 DIAGNOSIS — Z0000 Encounter for general adult medical examination without abnormal findings: Secondary | ICD-10-CM | POA: Diagnosis not present

## 2023-11-26 DIAGNOSIS — J3489 Other specified disorders of nose and nasal sinuses: Secondary | ICD-10-CM | POA: Diagnosis not present

## 2023-11-26 DIAGNOSIS — R0981 Nasal congestion: Secondary | ICD-10-CM | POA: Diagnosis not present

## 2023-11-26 DIAGNOSIS — R051 Acute cough: Secondary | ICD-10-CM | POA: Diagnosis not present

## 2023-11-26 DIAGNOSIS — J0100 Acute maxillary sinusitis, unspecified: Secondary | ICD-10-CM | POA: Diagnosis not present

## 2023-12-01 DIAGNOSIS — D485 Neoplasm of uncertain behavior of skin: Secondary | ICD-10-CM | POA: Diagnosis not present

## 2023-12-30 DIAGNOSIS — D485 Neoplasm of uncertain behavior of skin: Secondary | ICD-10-CM | POA: Diagnosis not present

## 2024-02-18 DIAGNOSIS — Z1231 Encounter for screening mammogram for malignant neoplasm of breast: Secondary | ICD-10-CM | POA: Diagnosis not present

## 2024-02-18 DIAGNOSIS — Z130 Encounter for screening for diseases of the blood and blood-forming organs and certain disorders involving the immune mechanism: Secondary | ICD-10-CM | POA: Diagnosis not present

## 2024-02-18 DIAGNOSIS — Z01419 Encounter for gynecological examination (general) (routine) without abnormal findings: Secondary | ICD-10-CM | POA: Diagnosis not present

## 2024-05-24 DIAGNOSIS — L821 Other seborrheic keratosis: Secondary | ICD-10-CM | POA: Diagnosis not present

## 2024-05-24 DIAGNOSIS — L814 Other melanin hyperpigmentation: Secondary | ICD-10-CM | POA: Diagnosis not present

## 2024-05-24 DIAGNOSIS — L820 Inflamed seborrheic keratosis: Secondary | ICD-10-CM | POA: Diagnosis not present

## 2024-05-24 DIAGNOSIS — D2239 Melanocytic nevi of other parts of face: Secondary | ICD-10-CM | POA: Diagnosis not present

## 2024-05-24 DIAGNOSIS — D485 Neoplasm of uncertain behavior of skin: Secondary | ICD-10-CM | POA: Diagnosis not present

## 2024-05-24 DIAGNOSIS — D225 Melanocytic nevi of trunk: Secondary | ICD-10-CM | POA: Diagnosis not present

## 2024-07-13 ENCOUNTER — Ambulatory Visit: Attending: Cardiology | Admitting: Cardiology

## 2024-07-13 ENCOUNTER — Encounter: Payer: Self-pay | Admitting: Cardiology

## 2024-07-13 VITALS — BP 114/70 | HR 73 | Ht 62.0 in | Wt 138.0 lb

## 2024-07-13 DIAGNOSIS — I259 Chronic ischemic heart disease, unspecified: Secondary | ICD-10-CM | POA: Diagnosis not present

## 2024-07-13 DIAGNOSIS — I10 Essential (primary) hypertension: Secondary | ICD-10-CM | POA: Diagnosis not present

## 2024-07-13 DIAGNOSIS — I209 Angina pectoris, unspecified: Secondary | ICD-10-CM | POA: Diagnosis not present

## 2024-07-13 MED ORDER — NITROGLYCERIN 0.4 MG SL SUBL: 0.4000 mg | SUBLINGUAL_TABLET | SUBLINGUAL | 6 refills | Status: AC | PRN

## 2024-07-13 MED ORDER — METOPROLOL TARTRATE 100 MG PO TABS: 100.0000 mg | ORAL_TABLET | Freq: Once | ORAL | 0 refills | Status: AC

## 2024-07-13 NOTE — Progress Notes (Signed)
 Cardiology Office Note:    Date:  07/13/2024   ID:  Cheryl Willis, DOB 04/18/1961, MRN [id number]  PCP:  Street, Lonni HERO, MD  Cardiologist:  Jennifer JONELLE Crape, MD   Referring MD: Street, Lonni HERO, *    ASSESSMENT:    1. Primary hypertension   2. Angina pectoris (HCC)    PLAN:    In order of problems listed above:  Primary prevention stressed with the patient.  Importance of compliance with diet medication stressed and patient verbalized standing. Essential hypertension: Blood pressure is stable and diet was emphasized.  Lifestyle modification urged. Angina pectoris: Patient's symptoms are concerning.  She really wants to get a definitive test as stress brings her about her symptoms and she wonders if I could order a test for her that would give her a definitive answer about her coronary anatomy.  I discussed CT coronary angiography with FFR.  She is agreeable.  Will schedule this test for her.  Further recommendations will depend on the findings of this test. Patient will be seen in follow-up appointment in 6 months or earlier if the patient has any concerns.    Medication Adjustments/Labs and Tests Ordered: Current medicines are reviewed at length with the patient today.  Concerns regarding medicines are outlined above.  Orders Placed This Encounter  Procedures   EKG 12-Lead   No orders of the defined types were placed in this encounter.    No chief complaint on file.    History of Present Illness:    Cheryl Willis is a 63 y.o. female.  Patient has past medical history of essential hypertension.  She mentions to me that she is under stress.  Her dad passed away several months ago.  She has stress at the job and she has chest tightness radiating to the left arm at times.  No orthopnea or PND.  She is concerned about this.  She is an active lady.  At the time of my evaluation, the patient is alert awake oriented and in no distress.  Her calcium score was 0 in  2024.  Past Medical History:  Diagnosis Date   Anemia 07/28/2018   Blood in urine 07/28/2018   Calf pain    Cancer (HCC)    Chest discomfort 09/07/2020   Chest pain 08/19/2017   Hypertension    Menopausal syndrome 07/28/2018   Mixed dyslipidemia 09/07/2020   Wears glasses     Past Surgical History:  Procedure Laterality Date   COLONOSCOPY     EXCISION METACARPAL MASS Left 09/29/2013   Procedure: LEFT INDEX EXCISION MASS AND DEBRIDEMENT DIP JOINT;  Surgeon: Franky JONELLE Curia, MD;  [facility];  Service: Orthopedics;  Laterality: Left;   FINGER SURGERY     cyst removed   WISDOM TOOTH EXTRACTION      Current Medications: Current Meds  Medication Sig   Multiple Vitamins-Minerals (MULTIVITAMIN WITH MINERALS) tablet Take 1 tablet by mouth daily.   valsartan-hydrochlorothiazide (DIOVAN-HCT) 160-25 MG tablet Take 1 tablet by mouth daily.     Allergies:   Patient has no known allergies.   Social History   Socioeconomic History   Marital status: Divorced    Spouse name: Not on file   Number of children: Not on file   Years of education: Not on file   Highest education level: Not on file  Occupational History   Not on file  Tobacco Use   Smoking status: Never   Smokeless tobacco: Never  Vaping Use   Vaping status: Never Used  Substance and Sexual Activity   Alcohol use: Yes    Comment: occ   Drug use: No   Sexual activity: Not on file  Other Topics Concern   Not on file  Social History Narrative   Not on file   Social Drivers of Health   Financial Resource Strain: Not on file  Food Insecurity: Not on file  Transportation Needs: Not on file  Physical Activity: Not on file  Stress: Not on file  Social Connections: Not on file     Family History: The patient's family history includes Heart attack in her mother.  ROS:   Please see the history of present illness.    All other systems reviewed and are negative.  EKGs/Labs/Other Studies  Reviewed:    The following studies were reviewed today: .SABRAEKG Interpretation Date/Time:  Wednesday July 13 2024 15:23:20 EDT Ventricular Rate:  73 PR Interval:  148 QRS Duration:  76 QT Interval:  404 QTC Calculation: 445 R Axis:   28  Text Interpretation: Normal sinus rhythm Cannot rule out Anterior infarct , age undetermined Abnormal ECG When compared with ECG of 15-Jul-2023 15:55, No significant change was found Confirmed by Edwyna Backers [id number]) on 07/13/2024 3:56:36 PM     Recent Labs: No results found for requested labs within last 365 days.  Recent Lipid Panel No results found for: CHOL, TRIG, HDL, CHOLHDL, VLDL, LDLCALC, LDLDIRECT  Physical Exam:    VS:  BP 114/70   Pulse 73   Ht 5' 2 (1.575 m)   Wt 138 lb (62.6 kg)   LMP 09/27/2013   SpO2 97%   BMI 25.24 kg/m     Wt Readings from Last 3 Encounters:  07/13/24 138 lb (62.6 kg)  07/15/23 134 lb 12.8 oz (61.1 kg)  03/21/22 131 lb 9.6 oz (59.7 kg)     GEN: Patient is in no acute distress HEENT: Normal NECK: No JVD; No carotid bruits LYMPHATICS: No lymphadenopathy CARDIAC: Hear sounds regular, 2/6 systolic murmur at the apex. RESPIRATORY:  Clear to auscultation without rales, wheezing or rhonchi  ABDOMEN: Soft, non-tender, non-distended MUSCULOSKELETAL:  No edema; No deformity  SKIN: Warm and dry NEUROLOGIC:  Alert and oriented x 3 PSYCHIATRIC:  Normal affect   Signed, Backers JONELLE Edwyna, MD  07/13/2024 3:50 PM    [facility]

## 2024-07-13 NOTE — Patient Instructions (Signed)
 Medication Instructions:  Your physician has recommended you make the following change in your medication:   Use nitroglycerin  1 tablet placed under the tongue at the first sign of chest pain or an angina attack. 1 tablet may be used every 5 minutes as needed, for up to 15 minutes. Do not take more than 3 tablets in 15 minutes. If pain persist call 911 or go to the nearest ED.   *If you need a refill on your cardiac medications before your next appointment, please call your pharmacy*   Lab Work: None ordered If you have labs (blood work) drawn today and your tests are completely normal, you will receive your results only by: MyChart Message (if you have MyChart) OR A paper copy in the mail If you have any lab test that is abnormal or we need to change your treatment, we will call you to review the results.   Testing/Procedures:   Your cardiac CT will be scheduled at one of the below locations:   [facility] [address] [place], [place]  Please follow these instructions carefully (unless otherwise directed):  An IV will be required for this test and Nitroglycerin  will be given.   On the Night Before the Test: Be sure to Drink plenty of water. Do not consume any caffeinated/decaffeinated beverages or chocolate 12 hours prior to your test. Do not take any antihistamines 12 hours prior to your test.  On the Day of the Test: Drink plenty of water until 1 hour prior to the test. Do not eat any food 1 hour prior to test. You may take your regular medications prior to the test.  Take metoprolol  (Lopressor ) 100 mg two hours prior to test. FEMALES- please wear underwire-free bra if available, avoid dresses & tight clothing  After the Test: Drink plenty of water. After receiving IV contrast, you may experience a mild flushed feeling. This is normal. On occasion, you may experience a mild rash up to 24 hours after the test. This is not dangerous. If this occurs, you can take  Benadryl 25 mg, Zyrtec, Claritin, or Allegra and increase your fluid intake. (Patients taking Tikosyn should avoid Benadryl, and may take Zyrtec, Claritin, or Allegra) If you experience trouble breathing, this can be serious. If it is severe call 911 IMMEDIATELY. If it is mild, please call our office.  We will call to schedule your test 2-4 weeks out understanding that some insurance companies will need an authorization prior to the service being performed.   For more information and frequently asked questions, please visit our website : [email or web address]  For non-scheduling related questions, please contact the cardiac imaging nurse navigator should you have any questions/concerns: Cardiac Imaging Nurse Navigators Direct Office Dial: [phone]   For scheduling needs, including cancellations and rescheduling, please call [place], [phone].    Follow-Up: At [facility], you and your health needs are our priority.  As part of our continuing mission to provide you with exceptional heart care, we have created designated Provider Care Teams.  These Care Teams include your primary Cardiologist (physician) and Advanced Practice Providers (APPs -  Physician Assistants and Nurse Practitioners) who all work together to provide you with the care you need, when you need it.  We recommend signing up for the patient portal called MyChart.  Sign up information is provided on this After Visit Summary.  MyChart is used to connect with patients for Virtual Visits (Telemedicine).  Patients are able to view lab/test results, encounter notes, upcoming  appointments, etc.  Non-urgent messages can be sent to your provider as well.   To learn more about what you can do with MyChart, go to [email or web address].    Your next appointment:   12 month(s)  The format for your next appointment:   In Person  Provider:   Jennifer Crape, MD    Other Instructions none  Important  Information About Sugar

## 2024-08-25 ENCOUNTER — Other Ambulatory Visit (HOSPITAL_BASED_OUTPATIENT_CLINIC_OR_DEPARTMENT_OTHER): Admitting: Radiology

## 2024-09-06 ENCOUNTER — Encounter (HOSPITAL_COMMUNITY): Payer: Self-pay

## 2024-09-08 ENCOUNTER — Ambulatory Visit (INDEPENDENT_AMBULATORY_CARE_PROVIDER_SITE_OTHER)
Admission: RE | Admit: 2024-09-08 | Discharge: 2024-09-08 | Disposition: A | Discharge status: Home / Self Care | Source: Ambulatory Visit | Attending: Cardiology | Admitting: Cardiology

## 2024-09-08 DIAGNOSIS — I259 Chronic ischemic heart disease, unspecified: Secondary | ICD-10-CM | POA: Diagnosis not present

## 2024-09-08 DIAGNOSIS — I209 Angina pectoris, unspecified: Secondary | ICD-10-CM | POA: Diagnosis not present

## 2024-09-08 DIAGNOSIS — R079 Chest pain, unspecified: Secondary | ICD-10-CM | POA: Diagnosis not present

## 2024-09-08 MED ORDER — NITROGLYCERIN 0.4 MG SL SUBL
0.8000 mg | SUBLINGUAL_TABLET | Freq: Once | SUBLINGUAL | Status: AC
  Administered 2024-09-08: 0.8 mg via SUBLINGUAL

## 2024-09-08 MED ORDER — IOHEXOL 350 MG/ML SOLN
100.0000 mL | Freq: Once | INTRAVENOUS | Status: AC | PRN
  Administered 2024-09-08: 100 mL via INTRAVENOUS

## 2024-09-15 ENCOUNTER — Encounter: Payer: Self-pay | Admitting: Cardiology

## 2024-09-19 ENCOUNTER — Ambulatory Visit: Payer: Self-pay | Admitting: Cardiology

## 2024-09-26 DIAGNOSIS — Z6820 Body mass index (BMI) 20.0-20.9, adult: Secondary | ICD-10-CM | POA: Diagnosis not present

## 2024-09-26 DIAGNOSIS — Z0000 Encounter for general adult medical examination without abnormal findings: Secondary | ICD-10-CM | POA: Diagnosis not present

## 2024-09-26 DIAGNOSIS — Z131 Encounter for screening for diabetes mellitus: Secondary | ICD-10-CM | POA: Diagnosis not present

## 2024-09-26 DIAGNOSIS — Z1331 Encounter for screening for depression: Secondary | ICD-10-CM | POA: Diagnosis not present

## 2024-09-26 DIAGNOSIS — Z13220 Encounter for screening for lipoid disorders: Secondary | ICD-10-CM | POA: Diagnosis not present

## 2024-09-26 DIAGNOSIS — Z23 Encounter for immunization: Secondary | ICD-10-CM | POA: Diagnosis not present

## 2024-10-12 DIAGNOSIS — D485 Neoplasm of uncertain behavior of skin: Secondary | ICD-10-CM | POA: Diagnosis not present
